# Patient Record
Sex: Female | Born: 1970 | Race: Black or African American
Health system: Southern US, Community
[De-identification: ages and names within clinical notes are randomized; demographics above are authoritative.]

## PROBLEM LIST (undated history)

## (undated) DIAGNOSIS — E1110 Type 2 diabetes mellitus with ketoacidosis without coma: Secondary | ICD-10-CM

## (undated) DIAGNOSIS — D649 Anemia, unspecified: Secondary | ICD-10-CM

## (undated) DIAGNOSIS — J45909 Unspecified asthma, uncomplicated: Secondary | ICD-10-CM

## (undated) DIAGNOSIS — K219 Gastro-esophageal reflux disease without esophagitis: Secondary | ICD-10-CM

## (undated) DIAGNOSIS — E119 Type 2 diabetes mellitus without complications: Secondary | ICD-10-CM

## (undated) DIAGNOSIS — H539 Unspecified visual disturbance: Secondary | ICD-10-CM

## (undated) DIAGNOSIS — N189 Chronic kidney disease, unspecified: Secondary | ICD-10-CM

## (undated) HISTORY — PX: CORONARY ANGIOPLASTY WITH STENT PLACEMENT: SHX49

---

## 1998-10-18 ENCOUNTER — Other Ambulatory Visit: Admission: RE | Admit: 1998-10-18 | Discharge: 1998-10-18 | Payer: Self-pay | Admitting: Obstetrics & Gynecology

## 1999-09-24 ENCOUNTER — Inpatient Hospital Stay (HOSPITAL_COMMUNITY): Admission: EM | Admit: 1999-09-24 | Discharge: 1999-09-26 | Payer: Self-pay | Admitting: Emergency Medicine

## 1999-09-28 ENCOUNTER — Emergency Department (HOSPITAL_COMMUNITY): Admission: EM | Admit: 1999-09-28 | Discharge: 1999-09-28 | Payer: Self-pay | Admitting: Emergency Medicine

## 1999-10-02 ENCOUNTER — Encounter: Admission: RE | Admit: 1999-10-02 | Discharge: 1999-12-31 | Payer: Self-pay | Admitting: Internal Medicine

## 1999-11-15 ENCOUNTER — Other Ambulatory Visit: Admission: RE | Admit: 1999-11-15 | Discharge: 1999-11-15 | Payer: Self-pay | Admitting: Obstetrics & Gynecology

## 2000-01-24 ENCOUNTER — Encounter: Admission: RE | Admit: 2000-01-24 | Discharge: 2000-04-23 | Payer: Self-pay | Admitting: Internal Medicine

## 2001-07-04 ENCOUNTER — Ambulatory Visit
Admit: 2001-07-04 | Disposition: A | Discharge status: Home / Self Care | Payer: Self-pay | Source: Ambulatory Visit | Admitting: Obstetrics & Gynecology

## 2001-08-01 ENCOUNTER — Ambulatory Visit
Admit: 2001-08-01 | Disposition: A | Discharge status: Home / Self Care | Payer: Self-pay | Source: Ambulatory Visit | Admitting: Obstetrics & Gynecology

## 2001-08-12 ENCOUNTER — Ambulatory Visit
Admit: 2001-08-12 | Disposition: A | Discharge status: Home / Self Care | Payer: Self-pay | Source: Ambulatory Visit | Admitting: Obstetrics & Gynecology

## 2001-08-29 ENCOUNTER — Ambulatory Visit
Admit: 2001-08-29 | Disposition: A | Discharge status: Home / Self Care | Payer: Self-pay | Source: Ambulatory Visit | Admitting: Obstetrics & Gynecology

## 2001-11-10 ENCOUNTER — Ambulatory Visit
Admit: 2001-11-10 | Disposition: A | Discharge status: Home / Self Care | Payer: Self-pay | Source: Ambulatory Visit | Admitting: Obstetrics & Gynecology

## 2001-11-13 ENCOUNTER — Ambulatory Visit
Admit: 2001-11-13 | Disposition: A | Discharge status: Home / Self Care | Payer: Self-pay | Source: Ambulatory Visit | Admitting: Obstetrics & Gynecology

## 2001-11-17 ENCOUNTER — Ambulatory Visit
Admit: 2001-11-17 | Disposition: A | Discharge status: Home / Self Care | Payer: Self-pay | Source: Ambulatory Visit | Admitting: Obstetrics & Gynecology

## 2001-11-25 ENCOUNTER — Ambulatory Visit
Admit: 2001-11-25 | Disposition: A | Discharge status: Home / Self Care | Payer: Self-pay | Source: Ambulatory Visit | Admitting: Obstetrics & Gynecology

## 2001-12-01 ENCOUNTER — Ambulatory Visit
Admit: 2001-12-01 | Disposition: A | Discharge status: Home / Self Care | Payer: Self-pay | Source: Ambulatory Visit | Admitting: Obstetrics & Gynecology

## 2001-12-29 ENCOUNTER — Inpatient Hospital Stay (HOSPITAL_COMMUNITY): Admission: AD | Admit: 2001-12-29 | Discharge: 2002-01-01 | Payer: Self-pay | Admitting: Obstetrics & Gynecology

## 2002-11-04 ENCOUNTER — Emergency Department: Admit: 2002-11-04 | Payer: Self-pay | Source: Emergency Department | Admitting: Emergency Medicine

## 2004-06-23 ENCOUNTER — Emergency Department: Admit: 2004-06-23 | Payer: Self-pay | Source: Emergency Department | Admitting: Emergency Medicine

## 2011-03-13 ENCOUNTER — Ambulatory Visit
Admit: 2011-03-13 | Discharge: 2011-03-13 | Disposition: A | Discharge status: Home / Self Care | Payer: Self-pay | Source: Ambulatory Visit

## 2011-10-06 ENCOUNTER — Encounter (HOSPITAL_COMMUNITY): Payer: Self-pay | Admitting: Adult Health

## 2011-10-06 ENCOUNTER — Emergency Department (HOSPITAL_COMMUNITY)
Admission: EM | Admit: 2011-10-06 | Discharge: 2011-10-06 | Disposition: A | Discharge status: Home / Self Care | Payer: BC Managed Care – PPO | Attending: Emergency Medicine | Admitting: Emergency Medicine

## 2011-10-06 DIAGNOSIS — R112 Nausea with vomiting, unspecified: Secondary | ICD-10-CM | POA: Insufficient documentation

## 2011-10-06 DIAGNOSIS — R197 Diarrhea, unspecified: Secondary | ICD-10-CM | POA: Insufficient documentation

## 2011-10-06 DIAGNOSIS — R1013 Epigastric pain: Secondary | ICD-10-CM | POA: Insufficient documentation

## 2011-10-06 DIAGNOSIS — E119 Type 2 diabetes mellitus without complications: Secondary | ICD-10-CM | POA: Insufficient documentation

## 2011-10-06 HISTORY — DX: Type 2 diabetes mellitus with ketoacidosis without coma: E11.10

## 2011-10-06 LAB — BLOOD GAS, VENOUS
Patient temperature: 98.6
pCO2, Ven: 33.2 mmHg — ABNORMAL LOW (ref 45.0–50.0)
pH, Ven: 7.449 — ABNORMAL HIGH (ref 7.250–7.300)

## 2011-10-06 LAB — COMPREHENSIVE METABOLIC PANEL
ALT: 7 U/L (ref 0–35)
AST: 9 U/L (ref 0–37)
Alkaline Phosphatase: 82 U/L (ref 39–117)
CO2: 25 mEq/L (ref 19–32)
GFR calc Af Amer: >90 mL/min (ref >90)
Glucose, Bld: 253 mg/dL — ABNORMAL HIGH (ref 70–99)
Potassium: 3.3 mEq/L — ABNORMAL LOW (ref 3.5–5.1)
Sodium: 133 mEq/L — ABNORMAL LOW (ref 135–145)
Total Protein: 7.9 g/dL (ref 6.0–8.3)

## 2011-10-06 LAB — DIFFERENTIAL
Eosinophils Absolute: 0 10*3/uL (ref 0.0–0.7)
Lymphocytes Relative: 6 % — ABNORMAL LOW (ref 12–46)
Lymphs Abs: 0.7 10*3/uL (ref 0.7–4.0)
Neutrophils Relative %: 91 % — ABNORMAL HIGH (ref 43–77)

## 2011-10-06 LAB — CBC
Platelets: 314 10*3/uL (ref 150–400)
RBC: 4.33 MIL/uL (ref 3.87–5.11)
WBC: 13.5 10*3/uL — ABNORMAL HIGH (ref 4.0–10.5)

## 2011-10-06 MED ORDER — SODIUM CHLORIDE 0.9 % IV BOLUS (SEPSIS)
1000.0000 mL | Freq: Once | INTRAVENOUS | Status: AC
  Administered 2011-10-06: 1000 mL via INTRAVENOUS

## 2011-10-06 MED ORDER — ACETAMINOPHEN 325 MG PO TABS
ORAL_TABLET | ORAL | Status: AC
  Administered 2011-10-06: 650 mg
  Filled 2011-10-06: qty 2

## 2011-10-06 MED ORDER — ONDANSETRON HCL 4 MG/2ML IJ SOLN
4.0000 mg | Freq: Once | INTRAMUSCULAR | Status: AC
  Administered 2011-10-06: 4 mg via INTRAVENOUS
  Filled 2011-10-06: qty 2

## 2011-10-06 NOTE — ED Notes (Signed)
Pt given ice chips for dry mouth

## 2011-10-06 NOTE — ED Notes (Addendum)
Sudden onset of vomitting and diarrhea that began this am while driving from [place]. Pt states it feels just like when she had DKA. HR 118, BP 121/84 CBG 247.  Admits to going without humalog for one week. Alert and oriented, answer questions appropriately. Pt is currently taking her lantus regularly.

## 2011-10-06 NOTE — ED Provider Notes (Signed)
History     CSN: [id number]  Arrival date & time 10/06/11  1541   First MD Initiated Contact with Patient 10/06/11 1603      Chief Complaint  Patient presents with  . Diarrhea  . Emesis    HPI The patient presents with one day of nausea, vomiting, diarrhea, mild epigastric discomfort.  She notes that she has been in her usual state of health.  This morning, approximately 9 hours ago, following a fast food breakfast, the patient gradually developed discomfort.  Since onset she has had the aforementioned symptoms persistently.  Symptoms did not improve following a period of rest.  She has not tried any pharmacologic means of relief.  No clear exacerbating factors.  No fevers, no confusion, no dyspnea, no other focal complaints. Past Medical History  Diagnosis Date  . Diabetes mellitus   . DKA (diabetic ketoacidoses)     Past Surgical History  Procedure Date  . Cesarean section     History reviewed. No pertinent family history.  History  Substance Use Topics  . Smoking status: Never Smoker   . Smokeless tobacco: Not on file  . Alcohol Use: No    OB History    Grav Para Term Preterm Abortions TAB SAB Ect Mult Living                  Review of Systems  Constitutional:       HPI  HENT:       HPI otherwise negative  Eyes: Negative.   Respiratory:       HPI, otherwise negative  Cardiovascular:       HPI, otherwise nmegative  Gastrointestinal: Positive for nausea, vomiting and diarrhea. Negative for abdominal pain and blood in stool.  Genitourinary:       HPI, otherwise negative  Musculoskeletal:       HPI, otherwise negative  Skin: Negative.   Neurological: Negative for syncope.    Allergies  Penicillins  Home Medications   Current Outpatient Rx  Name Route Sig Dispense Refill  . FERROUS SULFATE 325 (65 FE) MG PO TABS Oral Take 325 mg by mouth daily with breakfast.    . INSULIN GLARGINE 100 UNIT/ML [place] SOLN Subcutaneous Inject 30 Units into the skin at  bedtime.    . INSULIN LISPRO (HUMAN) 100 UNIT/ML [place] SOLN Subcutaneous Inject 12 Units into the skin 3 (three) times daily before meals.    Marland Kitchen RAMIPRIL 2.5 MG PO CAPS Oral Take 2.5 mg by mouth daily.    Marland Kitchen VITAMIN B-12 1000 MCG PO TABS Oral Take 1,000 mcg by mouth daily.      BP 121/84  Pulse 118  Temp(Src) 98.5 F (36.9 C) (Oral)  Resp 20  Wt 197 lb (89.359 kg)  SpO2 99%  Physical Exam  Nursing note and vitals reviewed. Constitutional: She is oriented to person, place, and time. She appears well-developed and well-nourished. No distress.  HENT:  Head: Normocephalic and atraumatic.  Eyes: Conjunctivae and EOM are normal.  Cardiovascular: Regular rhythm.  Tachycardia present.   Pulmonary/Chest: Effort normal and breath sounds normal. No stridor. No respiratory distress.  Abdominal: She exhibits no distension.  Musculoskeletal: She exhibits no edema.  Neurological: She is alert and oriented to person, place, and time. No cranial nerve deficit.  Skin: Skin is warm and dry.  Psychiatric: She has a normal mood and affect.    ED Course  Procedures (including critical care time)  Labs Reviewed  GLUCOSE, CAPILLARY - Abnormal; Notable  for the following:    Glucose-Capillary 246 (*)    All other components within normal limits  CBC  DIFFERENTIAL  COMPREHENSIVE METABOLIC PANEL  URINALYSIS, ROUTINE W REFLEX MICROSCOPIC  BLOOD GAS, VENOUS   No results found.   No diagnosis found.   Rule out diabetic ketoacidosis.  Symptoms likely dehydration versus acute food reaction.  10:33 PM Patient notes substantial reduction in her discomfort.   MDM  This 41 year old female insulin-dependent diabetes now presents on the day of nausea, vomiting, diarrhea.  On exam she is in no distress, though she is mildly tachycardic.  With her history is concern for DKA or infectious etiology.  The patient's labs are reassuring for the absence of an anion gap.  She does have mild leukocytosis.  Following  ED interventions, showed a substantial improvement in her clinical condition.  He was discharged in stable condition to follow up with her primary care physician.        Gerhard Munch, MD 10/06/11 2234

## 2011-10-06 NOTE — ED Notes (Signed)
Pt sts that her N/V/D since earlier this morning. Sts that she is from [place]. Bowel sounds audible x 4 quads.

## 2011-10-08 LAB — BLOOD GAS, VENOUS
Patient temperature: 98.6
pCO2, Ven: 40.6 mmHg — ABNORMAL LOW (ref 45.0–50.0)
pH, Ven: 7.405 — ABNORMAL HIGH (ref 7.250–7.300)

## 2011-11-15 LAB — BLOOD GAS, VENOUS

## 2012-10-01 ENCOUNTER — Ambulatory Visit: Payer: No Typology Code available for payment source

## 2012-10-08 ENCOUNTER — Encounter: Payer: Self-pay | Admitting: Certified Registered"

## 2012-10-08 ENCOUNTER — Encounter
Admission: RE | Disposition: A | Discharge status: Home / Self Care | Payer: Self-pay | Source: Ambulatory Visit | Attending: Obstetrics & Gynecology

## 2012-10-08 ENCOUNTER — Ambulatory Visit: Payer: No Typology Code available for payment source | Admitting: Certified Registered"

## 2012-10-08 ENCOUNTER — Ambulatory Visit: Payer: No Typology Code available for payment source | Admitting: Obstetrics & Gynecology

## 2012-10-08 ENCOUNTER — Ambulatory Visit
Admission: RE | Admit: 2012-10-08 | Discharge: 2012-10-08 | Disposition: A | Discharge status: Home / Self Care | Payer: No Typology Code available for payment source | Source: Ambulatory Visit | Attending: Obstetrics & Gynecology | Admitting: Obstetrics & Gynecology

## 2012-10-08 DIAGNOSIS — J45909 Unspecified asthma, uncomplicated: Secondary | ICD-10-CM | POA: Insufficient documentation

## 2012-10-08 DIAGNOSIS — K219 Gastro-esophageal reflux disease without esophagitis: Secondary | ICD-10-CM | POA: Insufficient documentation

## 2012-10-08 DIAGNOSIS — Z79899 Other long term (current) drug therapy: Secondary | ICD-10-CM | POA: Insufficient documentation

## 2012-10-08 DIAGNOSIS — N920 Excessive and frequent menstruation with regular cycle: Secondary | ICD-10-CM | POA: Insufficient documentation

## 2012-10-08 DIAGNOSIS — N8000 Endometriosis of the uterus, unspecified: Secondary | ICD-10-CM | POA: Insufficient documentation

## 2012-10-08 DIAGNOSIS — Z880 Allergy status to penicillin: Secondary | ICD-10-CM | POA: Insufficient documentation

## 2012-10-08 DIAGNOSIS — D649 Anemia, unspecified: Secondary | ICD-10-CM | POA: Insufficient documentation

## 2012-10-08 DIAGNOSIS — E119 Type 2 diabetes mellitus without complications: Secondary | ICD-10-CM | POA: Insufficient documentation

## 2012-10-08 HISTORY — DX: Type 2 diabetes mellitus without complications: E11.9

## 2012-10-08 HISTORY — DX: Unspecified visual disturbance: H53.9

## 2012-10-08 HISTORY — DX: Unspecified asthma, uncomplicated: J45.909

## 2012-10-08 HISTORY — DX: Anemia, unspecified: D64.9

## 2012-10-08 HISTORY — DX: Type 2 diabetes mellitus with ketoacidosis without coma: E11.10

## 2012-10-08 HISTORY — DX: Gastro-esophageal reflux disease without esophagitis: K21.9

## 2012-10-08 HISTORY — PX: HYSTEROSCOPY, ENDOMETRIAL ABLATION, THERMACHOICE: SHX4233

## 2012-10-08 LAB — POCT GLUCOSE: Whole Blood Glucose POCT: 218 mg/dL — AB (ref 70–100)

## 2012-10-08 LAB — POCT PREGNANCY TEST, URINE HCG: POCT Pregnancy HCG Test, UR: NEGATIVE

## 2012-10-08 SURGERY — HYSTEROSCOPY, ENDOMETRIAL ABLATION, THERMACHOICE
Anesthesia: Anesthesia General | Site: Uterus | Wound class: Clean Contaminated

## 2012-10-08 MED ORDER — ONDANSETRON HCL 4 MG/2ML IJ SOLN
INTRAMUSCULAR | Status: DC | PRN
Start: 2012-10-08 — End: 2012-10-08
  Administered 2012-10-08: 4 mg via INTRAVENOUS

## 2012-10-08 MED ORDER — ONDANSETRON HCL 4 MG/2ML IJ SOLN
4.0000 mg | Freq: Once | INTRAMUSCULAR | Status: DC | PRN
Start: 2012-10-08 — End: 2012-10-08

## 2012-10-08 MED ORDER — SILVER NITRATE-POT NITRATE 75-25 % EX MISC
CUTANEOUS | Status: AC
Start: 2012-10-08 — End: ?
  Filled 2012-10-08: qty 60

## 2012-10-08 MED ORDER — HYDROMORPHONE HCL PF 1 MG/ML IJ SOLN
INTRAMUSCULAR | Status: AC
Start: 2012-10-08 — End: 2012-10-08
  Administered 2012-10-08: 0.2 mg via INTRAVENOUS
  Filled 2012-10-08: qty 1

## 2012-10-08 MED ORDER — OXYCODONE-ACETAMINOPHEN 5-325 MG PO TABS
1.0000 | ORAL_TABLET | ORAL | Status: DC | PRN
Start: 2012-10-08 — End: 2012-10-08
  Administered 2012-10-08: 1 via ORAL

## 2012-10-08 MED ORDER — FENTANYL CITRATE 0.05 MG/ML IJ SOLN
INTRAMUSCULAR | Status: DC | PRN
Start: 2012-10-08 — End: 2012-10-08
  Administered 2012-10-08: 50 ug via INTRAVENOUS
  Administered 2012-10-08: 25 ug via INTRAVENOUS
  Administered 2012-10-08: 50 ug via INTRAVENOUS

## 2012-10-08 MED ORDER — PROMETHAZINE HCL 25 MG/ML IJ SOLN
6.2500 mg | Freq: Once | INTRAMUSCULAR | Status: DC | PRN
Start: 2012-10-08 — End: 2012-10-08

## 2012-10-08 MED ORDER — FENTANYL CITRATE 0.05 MG/ML IJ SOLN
INTRAMUSCULAR | Status: AC
Start: 2012-10-08 — End: ?
  Filled 2012-10-08: qty 5

## 2012-10-08 MED ORDER — ACETAMINOPHEN-CODEINE #3 300-30 MG PO TABS
1.0000 | ORAL_TABLET | Freq: Four times a day (QID) | ORAL | Status: DC | PRN
Start: 2012-10-08 — End: 2014-12-23

## 2012-10-08 MED ORDER — FENTANYL CITRATE 0.05 MG/ML IJ SOLN
25.0000 ug | INTRAMUSCULAR | Status: DC | PRN
Start: 2012-10-08 — End: 2012-10-08

## 2012-10-08 MED ORDER — OXYCODONE-ACETAMINOPHEN 5-325 MG PO TABS
ORAL_TABLET | ORAL | Status: DC
Start: 2012-10-08 — End: 2012-10-08
  Filled 2012-10-08: qty 1

## 2012-10-08 MED ORDER — MIDAZOLAM HCL 2 MG/2ML IJ SOLN
INTRAMUSCULAR | Status: DC | PRN
Start: 2012-10-08 — End: 2012-10-08
  Administered 2012-10-08: 2 mg via INTRAVENOUS

## 2012-10-08 MED ORDER — LACTATED RINGERS IV SOLN
INTRAVENOUS | Status: DC | PRN
Start: 2012-10-08 — End: 2012-10-08

## 2012-10-08 MED ORDER — HYDROMORPHONE HCL PF 1 MG/ML IJ SOLN
0.2000 mg | INTRAMUSCULAR | Status: DC | PRN
Start: 2012-10-08 — End: 2012-10-08
  Administered 2012-10-08 (×2): 0.2 mg via INTRAVENOUS

## 2012-10-08 MED ORDER — PROPOFOL INFUSION 10 MG/ML
INTRAVENOUS | Status: DC | PRN
Start: 2012-10-08 — End: 2012-10-08
  Administered 2012-10-08: 200 mg via INTRAVENOUS
  Administered 2012-10-08: 70 mg via INTRAVENOUS

## 2012-10-08 MED ORDER — DEXTROSE 5 % IV SOLN
INTRAVENOUS | Status: AC
Start: 2012-10-08 — End: ?
  Filled 2012-10-08: qty 100

## 2012-10-08 MED ORDER — MIDAZOLAM HCL 2 MG/2ML IJ SOLN
INTRAMUSCULAR | Status: AC
Start: 2012-10-08 — End: ?
  Filled 2012-10-08: qty 2

## 2012-10-08 SURGICAL SUPPLY — 24 items
BAG DECANTER STERILE (Procedure Accessories) IMPLANT
BALLOON THERMACHOICE II (Balloon) IMPLANT
CATH ROB NEL 16F (Catheter Urine) ×2 IMPLANT
DRAPE SRG CNVRT 44X40IN LF STRL FLTR (Drape) ×2
DRAPE SURGICAL FILTER SCREEN FLUID CONTROL POUCH DRAINAGE PORT L44 IN (Drape) ×1 IMPLANT
DRESSING TELFA 3X8IN STERILE (Dressing) ×2 IMPLANT
GLOVE SRG NTR RBR 7 BGL IND 288X91MM LTX (Glove) ×1
GLOVE SURG BIOGEL SZ6.5 (Glove) ×2 IMPLANT
GLOVE SURGICAL 7 BIOGEL INDICATOR POWDER (Glove) ×1
GLOVE SURGICAL 7 BIOGEL INDICATOR POWDER FREE SMOOTH BEAD CUFF (Glove) ×1 IMPLANT
GOWN OPTIMA STRL BACK OR (Gown) ×4 IMPLANT
PACK LITHOTOMY (Pack) ×2 IMPLANT
PAD SANITARY L12.25 IN X W4.25 IN HEAVY ABSORBENT MOISTURE BARRIER (Dressing) IMPLANT
PAD SNTR SLK FLF CRTY 12.25X4.25IN LF NS (Dressing) ×1
SET IRR DEHP 10 GTT/ML STRG 81IN LF STRL (Tubing) ×1
SET IRRIGATION L81 IN 10 GTT/ML STRAIGHT (Tubing) ×1
SET IRRIGATION L81 IN 10 GTT/ML STRAIGHT NA DEHP BLADDER REGULATE (Tubing) ×1 IMPLANT
SPONGE SRG VISTEC 8X4IN LF STRL 12 PLY (Sponge) ×1
SPONGE SURGICAL L8 IN X W4 IN 12 PLY (Sponge) ×1
SPONGE SURGICAL L8 IN X W4 IN 12 PLY RADIOPAQUE BAND VISTEC BLUE WHITE (Sponge) ×1 IMPLANT
TOWEL L26 IN X W17 IN COTTON PREWASH (Procedure Accessories) ×1
TOWEL L26 IN X W17 IN COTTON PREWASH DELINT BLUE ACTISORB SURGICAL (Procedure Accessories) ×1 IMPLANT
TOWEL SRG CTTN 26X17IN LF STRL PREWASH (Procedure Accessories) ×1
TUBE ARTHRO CASSETTE SET (Ortho Supply) ×2 IMPLANT

## 2012-10-08 NOTE — Op Note (Signed)
FULL OPERATIVE NOTE    Date Time: 10/08/2012 3:17 PM  Patient Name: Elizabeth Dunlap  Attending Physician: Larae Grooms, MD      Date of Operation:   10/08/2012    Providers Performing:   Surgeon(s):  Larae Grooms, MD    Anesthesia Type:   LMA  Operative Procedure:   Diagnostic hysteroscopy, Thermachoice  endometrial ablation    Preoperative Diagnosis:   Menorrhagia, adenomyosis    Postoperative Diagnosis:   Menorrhagia, adenomyosis    Indications:    Menorrhagia    Operative Notes:   The patient was taken to the operating room, placed on the table in supine position. Anesthesia was initiated. She was then prepped and draped in the usual manner. In and out catheterization. Speculum placed in the vagina. Single-tooth tenaculum was used to grasp the anterior lip of the cervix. The cervical length was obtained. The uterus was then sounded. Cervix was dilated to admit the diagnostic hysteroscope. Hysteroscopy was performed using saline. The cavity was normal in shape and contour. The hysteroscope was the removed. The Thermachoice device was assembled . The ablation was performed for 8 minutes. The apparatus was then removed. Hysteroscope was removed. Single-tooth tenaculum was removed. Hemostasis was good. Vagina was wiped clean. Speculum was removed. The patient tolerated the procedure well and was transferred to recovery room in satisfactory condition.      Estimated Blood Loss:   Minimum    Specimens:       Findings:   Uterus sounds to 8 10 cm.  Complications:    None      Larae Grooms, MD  10/08/2012  3:17 PM

## 2012-10-08 NOTE — H&P (Signed)
I have reviewed the H&P, examined the patient and there are no changes.

## 2012-10-08 NOTE — Anesthesia Postprocedure Evaluation (Signed)
Anesthesia Post Evaluation    Patient: Elizabeth Dunlap    Procedures performed: Procedure(s) with comments:  HYSTEROSCOPY, ENDOMETRIAL ABLATION, THERMACHOICE - thermachoice    Anesthesia type: General LMA    Patient location:Phase II PACU    Last vitals:   Filed Vitals:    10/08/12 1710   BP: 128/78   Pulse: 80   Temp: 98 F (36.7 C)   Resp: 16   SpO2: 99%       Post pain: Patient not complaining of pain, continue current therapy     Mental Status:awake    Respiratory Function: tolerating room air    Cardiovascular: stable    Nausea/Vomiting: patient not complaining of nausea or vomiting    Hydration Status: adequate    Post assessment: no apparent anesthetic complications

## 2012-10-08 NOTE — Anesthesia Preprocedure Evaluation (Addendum)
Anesthesia Evaluation    AIRWAY    Mallampati: II    TM distance: <3 FB  Neck ROM: full  Mouth Opening:full   CARDIOVASCULAR    cardiovascular exam normal     DENTAL    No notable dental hx     PULMONARY    pulmonary exam normal     OTHER FINDINGS                  Anesthesia Plan    ASA 3   general         Post op pain management: per surgeon        intravenous induction     informed consent obtained    Plan discussed with CRNA.

## 2012-10-08 NOTE — Discharge Instructions (Signed)
Discharge Instructions for Hysteroscopy & Ablation   Your doctor performed a Hysteroscopy. The reasons for having this procedure vary from person to person. The hysteroscopy may be performed to control heavy uterine bleeding or to find the cause of irregular bleeding.   Home Care   Take it easy.   Expect watery vaginal discharge for 1-2 weeks   Return to your normal activities after 24-48 hours. You may also return to work at that time.   Eat a normal diet.   Take an over-the-counter pain reliever for pain, if needed.   Remember, it's okay to have bleeding for about a week after the procedure. The amount of bleeding should be similar to what you have during a normal period.   Don't lift anything heavier than 10 pounds for 1 week after the procedure.   Don't drive for 24 hours after the procedure.   Don't have sexual intercourse or use tampons or douches until your doctor says it's safe to do so. This usually takes 2 weeks.  Follow-Up   Make a follow-up appointment in 2 weeks.   When to Call Your Doctor   Call your doctor immediately if you have any of the following:   Bleeding that soaks more than one sanitary pad in one hour   Severe abdominal pain   Severe cramps   Fever above 100.23F   Chills   Smelly discharge from your vagina        Post Anesthesia Discharge Instructions    Although you may be awake and alert in the recovery room, small amounts of anesthetic remain in your system for about 24 hours.  You may feel tired and sleepy during this time.      You are advised to go directly home from the hospital.    Plan to stay at home and rest for the remainder of the day.    It is advisable to have someone with you at home for 24 hours after surgery.    Do not operate a motor vehicle, or any mechanical or electrical equipment for the next 24 hours.      Be careful when you are walking around, you may become dizzy.  The effects of anesthesia and/or medications are still present and drowsiness may occur    Do not  consume alcohol, tranquilizers, sleeping medications, or any other non prescribed medication for the remainder of the day.    Diet:  begin with liquids, progress your diet as tolerated or as directed by your surgeon.  Nausea and vomiting may occur in the next 24 hours.

## 2012-10-08 NOTE — Progress Notes (Signed)
Patient up with assistance, c/o nausea, refused to take any nausea medicine, ok to go home and rest, will call MD if nausea persists

## 2012-10-08 NOTE — Transfer of Care (Signed)
Anesthesia Transfer of Care Note    Patient: Elizabeth Dunlap    Procedures performed: Procedure(s) with comments:  HYSTEROSCOPY, ENDOMETRIAL ABLATION, THERMACHOICE - thermachoice    Anesthesia type: General LMA    Patient location:Phase I PACU    Last vitals:   Filed Vitals:    10/08/12 1538   BP: 123/81   Pulse: 89   Temp: 97.2 F (36.2 C)   Resp: 20   SpO2: 100%       Post pain: Patient not complaining of pain, continue current therapy     Mental Status:awake and alert     Respiratory Function: tolerating nasal cannula    Cardiovascular: stable    Nausea/Vomiting: patient not complaining of nausea or vomiting    Hydration Status: adequate    Post assessment: no apparent anesthetic complications and no reportable events

## 2012-10-08 NOTE — Discharge Summary (Signed)
Discharge Instructions for Hysteroscopy & Ablation   Your doctor performed a Hysteroscopy. The reasons for having this procedure vary from person to person. The hysteroscopy may be performed to control heavy uterine bleeding or to find the cause of irregular bleeding.  Home Care   Take it easy.    Expect watery vaginal discharge for 1-2 weeks   Return to your normal activities after 24-48 hours. You may also return to work at that time.   Eat a normal diet.   Take an over-the-counter pain reliever for pain, if needed.   Remember, it's okay to have bleeding for about a week after the procedure. The amount of bleeding should be similar to what you have during a normal period.   Don't lift anything heavier than 10 pounds for 1 week after the procedure.   Don't drive for 24 hours after the procedure.   Don't have sexual intercourse or use tampons or douches until your doctor says it's safe to do so. This usually takes 2 weeks.  Follow-Up   Make a follow-up appointment in 2 weeks.    When to Call Your Doctor  Call your doctor immediately if you have any of the following:   Bleeding that soaks more than one sanitary pad in one hour   Severe abdominal pain   Severe cramps   Fever above 100.58F   Chills   Smelly discharge from your vagina

## 2012-10-09 ENCOUNTER — Encounter: Payer: Self-pay | Admitting: Obstetrics & Gynecology

## 2014-01-30 ENCOUNTER — Emergency Department: Payer: BC Managed Care – PPO

## 2014-01-30 ENCOUNTER — Emergency Department
Admission: EM | Admit: 2014-01-30 | Discharge: 2014-01-30 | Disposition: A | Discharge status: Home / Self Care | Payer: BC Managed Care – PPO | Attending: Emergency Medicine | Admitting: Emergency Medicine

## 2014-01-30 DIAGNOSIS — M62838 Other muscle spasm: Secondary | ICD-10-CM | POA: Insufficient documentation

## 2014-01-30 DIAGNOSIS — J45909 Unspecified asthma, uncomplicated: Secondary | ICD-10-CM | POA: Insufficient documentation

## 2014-01-30 DIAGNOSIS — E119 Type 2 diabetes mellitus without complications: Secondary | ICD-10-CM | POA: Insufficient documentation

## 2014-01-30 DIAGNOSIS — Z862 Personal history of diseases of the blood and blood-forming organs and certain disorders involving the immune mechanism: Secondary | ICD-10-CM | POA: Insufficient documentation

## 2014-01-30 LAB — URINALYSIS, REFLEX TO MICROSCOPIC EXAM IF INDICATED
Bilirubin, UA: NEGATIVE
Glucose, UA: 500 — AB
Ketones UA: NEGATIVE
Leukocyte Esterase, UA: NEGATIVE
Nitrite, UA: NEGATIVE
Protein, UR: 100 — AB
Specific Gravity UA: 1.027 (ref 1.001–1.035)
Urine pH: 5 (ref 5.0–8.0)
Urobilinogen, UA: 2 mg/dL (ref 0.2–2.0)

## 2014-01-30 LAB — POCT PREGNANCY TEST, URINE HCG: POCT Pregnancy HCG Test, UR: NEGATIVE

## 2014-01-30 MED ORDER — KETOROLAC TROMETHAMINE 30 MG/ML IJ SOLN
30.00 mg | Freq: Once | INTRAMUSCULAR | Status: AC
Start: 2014-01-30 — End: 2014-01-30
  Administered 2014-01-30: 30 mg via INTRAMUSCULAR
  Filled 2014-01-30: qty 1

## 2014-01-30 MED ORDER — DIAZEPAM 2 MG PO TABS
2.0000 mg | ORAL_TABLET | Freq: Once | ORAL | Status: AC
Start: 2014-01-30 — End: 2014-01-30
  Administered 2014-01-30: 2 mg via ORAL
  Filled 2014-01-30: qty 1

## 2014-01-30 MED ORDER — DIAZEPAM 2 MG PO TABS
2.0000 mg | ORAL_TABLET | Freq: Three times a day (TID) | ORAL | Status: DC | PRN
Start: 2014-01-30 — End: 2014-12-23
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
## 2014-01-30 NOTE — ED Notes (Signed)
Pt discharged in stable condition. Instructions provided. Pt questions answered.

## 2014-01-30 NOTE — Discharge Instructions (Signed)
Please follow up with your primary care doctor and return if you have any problems or concerns  Please take your medications as prescribed (either the flexeril or valium) - and be sure to see sports medicine for physical therapy follow up.     Thank you for choosing [facility] for your emergency care needs.   We strive to provide EXCELLENT care to you and your family.     IF YOU DO NOT CONTINUE TO IMPROVE OR YOUR CONDITION WORSENS, PLEASE CONTACT YOUR DOCTOR OR RETURN IMMEDIATELY TO THE EMERGENCY DEPARTMENT.     DOCTOR REFERRALS   Call [phone] if you need any further referrals and we can help you find a primary care doctor or specialist. Also, available online at: [email or web address]     [place]   If you need help with health or social services, please call 2-1-1 for a free referral to resources in your area. 2-1-1 is a free service connecting people with information on health insurance, free clinics, pregnancy, mental health, dental care, food assistance, housing, and substance abuse counseling. Also, available online at: http://www.[employer].org     MEDICAL RECORDS AND TESTS   Certain laboratory test results do not come back the same day, for example urine cultures. We will contact you if other important findings are noted. Radiology films are often reviewed again to ensure accuracy. If there is any discrepancy, we will notify you.     ORTHOPEDIC INJURY   Please know that significant injuries can exist even when an initial x-ray is read as normal or negative. This can occur because some fractures (broken bones) are not initially visible on x-rays. For this reason, close outpatient follow-up with your primary care doctor or bone specialist (orthopedist) is required.     MEDICATIONS AND FOLLOWUP   Please be aware that some prescription medications can cause drowsiness. Use caution when driving or operating machinery.   The examination and treatment you have  received in our Emergency Department is provided on an emergency basis, and is not intended to be a substitute for your primary care physician. It is important that your doctor checks you again and that you report any new or remaining problems at that time.           Muscle Spasms    You have been diagnosed with muscle spasms.    A muscle spasm means that your muscles feel tight, crampy or painful. Many people have trouble relaxing their muscles when this happens. Most people will get a muscle spasm at some point.     There are many things that can cause muscle spasms. Some of them are:   Too much exercise.   Dehydration (often caused by heat exposure).   Electrolyte imbalance (low potassium, magnesium or phosphorus).   Changes in body fluids that can happen with liver or kidney failure.   Drug addiction and withdrawal.   Deficiency (not enough) of certain vitamins.   Peripheral Vascular Disease (narrow blood vessels in the legs).   Certain medications like furosemide (Lasix), albuterol (Proventil), cholesterol medications, and others.    You might need another exam or more tests to find out why you have these symptoms. At this time, the cause of your symptoms doesn't seem dangerous and you don t need to stay in the hospital.    Though we don't believe your condition is dangerous right now, it is important to be careful. Sometimes a problem that seems mild can become serious later. This  is why it is very important that you return here or go to the nearest Emergency Department if you are not improving or your symptoms are getting worse.    Some things you may try at home are:   Stretching.   Over-the-counter pain medications that have ibuprofen (Advil/Motrin) or acetaminophen (Tylenol) in them. Follow the directions on the package.   Massage.   Warm baths.   Eating a healthy and balanced diet.   Get plenty of rest.   Drink lots of liquids.   Exercise or activity if you are  careful.    Follow the instructions for any medication you are prescribed.    Follow up with your doctor if you are not getting better as expected.    YOU SHOULD SEEK MEDICAL ATTENTION IMMEDIATELY, EITHER HERE OR AT THE NEAREST EMERGENCY DEPARTMENT, IF ANY OF THE FOLLOWING HAPPENS:     You have a fever (temperature higher than 100.36F or 38C).   Your pain does not go away or gets worse.   Your urine (pee) turns red. This is a sign of muscle breakdown.   You do not feel better after treatment.   You have other symptoms, concerns, or don t get better as expected.    If you can t follow up with your doctor, or if at any time you feel you need to be rechecked or seen again, come back here or go to the nearest emergency department.       Torticollis    You have been diagnosed with "torticollis."    This is the medical term for muscle spasms in the neck and shoulder.    These muscle spasms are painful and may last from days to weeks. They make it hard to turn the head and move the neck and shoulder. It may be hard to lift using the affected arm.    Injuries like falls or broken bones do not usually cause these spasms. X-rays and other forms of radiology testing may be done but are usually normal. Often, these tests are not needed.    To treat it, rest the neck and affected shoulder. Do not do any heavy lifting and avoid repetitive activity. Do not hold the arm or shoulder up for long periods of time.    Gentle massage and range of motion exercises may help neck stiffness. The medical staff will show you how to do this. Warm, moist heat (like a warm, wet washcloth) may help the muscles relax and make you more comfortable.     Take any medicines as directed by the medical staff. You may be given restrictions for work or other activities. Follow them until symptoms get better or you follow up with your doctor.    It is important to follow up with the doctor, clinic or specialist as instructed by the  medical staff.    YOU SHOULD SEEK MEDICAL ATTENTION IMMEDIATELY, EITHER HERE OR AT THE NEAREST EMERGENCY DEPARTMENT, IF ANY OF THE FOLLOWING OCCURS:   Increasingly severe (serious) pain.   Arm weakness, paralysis or numbness.   Fever (temperature higher than 100.36F / 38C) or headache.   Swelling or redness around the neck, shoulder or armpit.   Chest pain or trouble breathing.

## 2014-12-22 ENCOUNTER — Observation Stay: Payer: BC Managed Care – PPO

## 2014-12-22 ENCOUNTER — Inpatient Hospital Stay: Payer: BC Managed Care – PPO | Admitting: Internal Medicine

## 2014-12-22 ENCOUNTER — Emergency Department: Payer: BC Managed Care – PPO

## 2014-12-22 ENCOUNTER — Observation Stay
Admission: EM | Admit: 2014-12-22 | Discharge: 2014-12-23 | Disposition: A | Discharge status: Home / Self Care | Payer: BC Managed Care – PPO | Attending: Internal Medicine | Admitting: Internal Medicine

## 2014-12-22 DIAGNOSIS — D638 Anemia in other chronic diseases classified elsewhere: Secondary | ICD-10-CM | POA: Insufficient documentation

## 2014-12-22 DIAGNOSIS — E785 Hyperlipidemia, unspecified: Secondary | ICD-10-CM | POA: Insufficient documentation

## 2014-12-22 DIAGNOSIS — I639 Cerebral infarction, unspecified: Secondary | ICD-10-CM | POA: Diagnosis present

## 2014-12-22 DIAGNOSIS — E860 Dehydration: Secondary | ICD-10-CM | POA: Insufficient documentation

## 2014-12-22 DIAGNOSIS — E869 Volume depletion, unspecified: Secondary | ICD-10-CM | POA: Diagnosis present

## 2014-12-22 DIAGNOSIS — K219 Gastro-esophageal reflux disease without esophagitis: Secondary | ICD-10-CM | POA: Insufficient documentation

## 2014-12-22 DIAGNOSIS — E1165 Type 2 diabetes mellitus with hyperglycemia: Secondary | ICD-10-CM | POA: Insufficient documentation

## 2014-12-22 DIAGNOSIS — N179 Acute kidney failure, unspecified: Secondary | ICD-10-CM | POA: Diagnosis present

## 2014-12-22 DIAGNOSIS — I10 Essential (primary) hypertension: Secondary | ICD-10-CM | POA: Insufficient documentation

## 2014-12-22 DIAGNOSIS — R739 Hyperglycemia, unspecified: Secondary | ICD-10-CM

## 2014-12-22 DIAGNOSIS — Z794 Long term (current) use of insulin: Secondary | ICD-10-CM | POA: Insufficient documentation

## 2014-12-22 DIAGNOSIS — E119 Type 2 diabetes mellitus without complications: Secondary | ICD-10-CM | POA: Diagnosis present

## 2014-12-22 DIAGNOSIS — Z880 Allergy status to penicillin: Secondary | ICD-10-CM | POA: Insufficient documentation

## 2014-12-22 DIAGNOSIS — R4701 Aphasia: Principal | ICD-10-CM | POA: Insufficient documentation

## 2014-12-22 LAB — BLOOD GAS, VENOUS
Base Excess, Ven: -2 mEq/L
HCO3, Ven: 23.2 mEq/L
O2 Sat, Venous: 38.3 %
Temperature: 37
Venous Total CO2: 21.6 mEq/L
pCO2, Venous: 43.7 mmhg
pH, Ven: 7.345
pO2, Venous: 24.7 mmhg

## 2014-12-22 LAB — COMPREHENSIVE METABOLIC PANEL
ALT: 10 U/L (ref 0–55)
AST (SGOT): 10 U/L (ref 5–34)
Albumin/Globulin Ratio: 1 (ref 0.9–2.2)
Albumin: 3.5 g/dL (ref 3.5–5.0)
Alkaline Phosphatase: 92 U/L (ref 37–106)
Anion Gap: 6 (ref 5.0–15.0)
BUN: 22 mg/dL — ABNORMAL HIGH (ref 7.0–19.0)
Bilirubin, Total: 0.2 mg/dL (ref 0.2–1.2)
CO2: 25 mEq/L (ref 22–29)
Calcium: 9.3 mg/dL (ref 8.5–10.5)
Chloride: 104 mEq/L (ref 100–111)
Creatinine: 1.3 mg/dL — ABNORMAL HIGH (ref 0.6–1.0)
Globulin: 3.6 g/dL (ref 2.0–3.6)
Glucose: 239 mg/dL — ABNORMAL HIGH (ref 70–100)
Potassium: 3.4 mEq/L — ABNORMAL LOW (ref 3.5–5.1)
Protein, Total: 7.1 g/dL (ref 6.0–8.3)
Sodium: 135 mEq/L — ABNORMAL LOW (ref 136–145)

## 2014-12-22 LAB — CBC AND DIFFERENTIAL
Basophils Absolute Automated: 0.02 10*3/uL (ref 0.00–0.20)
Basophils Automated: 0 %
Eosinophils Absolute Automated: 0.06 10*3/uL (ref 0.00–0.70)
Eosinophils Automated: 1 %
Hematocrit: 34 % — ABNORMAL LOW (ref 37.0–47.0)
Hgb: 11.8 g/dL — ABNORMAL LOW (ref 12.0–16.0)
Immature Granulocytes Absolute: 0.02 10*3/uL
Immature Granulocytes: 0 %
Lymphocytes Absolute Automated: 2.91 10*3/uL (ref 0.50–4.40)
Lymphocytes Automated: 28 %
MCH: 27.4 pg — ABNORMAL LOW (ref 28.0–32.0)
MCHC: 34.7 g/dL (ref 32.0–36.0)
MCV: 78.9 fL — ABNORMAL LOW (ref 80.0–100.0)
MPV: 11.5 fL (ref 9.4–12.3)
Monocytes Absolute Automated: 0.43 10*3/uL (ref 0.00–1.20)
Monocytes: 4 %
Neutrophils Absolute: 7.17 10*3/uL (ref 1.80–8.10)
Neutrophils: 68 %
Nucleated RBC: 0 /100 WBC (ref 0–1)
Platelets: 240 10*3/uL (ref 140–400)
RBC: 4.31 10*6/uL (ref 4.20–5.40)
RDW: 12 % (ref 12–15)
WBC: 10.59 10*3/uL (ref 3.50–10.80)

## 2014-12-22 LAB — HEMOLYSIS INDEX: Hemolysis Index: 6 (ref 0–18)

## 2014-12-22 LAB — GFR: EGFR: 53.5

## 2014-12-22 LAB — TROPONIN I: Troponin I: <0.01 ng/mL (ref 0.00–0.09)

## 2014-12-22 LAB — GLUCOSE WHOLE BLOOD - POCT: Whole Blood Glucose POCT: 97 mg/dL (ref 70–100)

## 2014-12-22 MED ORDER — ASPIRIN 81 MG PO CHEW
81.0000 mg | CHEWABLE_TABLET | Freq: Every day | ORAL | Status: DC
Start: 2014-12-23 — End: 2014-12-23
  Administered 2014-12-23: 81 mg via ORAL
  Filled 2014-12-22: qty 1

## 2014-12-22 MED ORDER — ENOXAPARIN SODIUM 40 MG/0.4ML SC SOLN
40.0000 mg | Freq: Every day | SUBCUTANEOUS | Status: DC
Start: 2014-12-23 — End: 2014-12-23
  Filled 2014-12-22: qty 0.4

## 2014-12-22 MED ORDER — GADOBUTROL 1 MMOL/ML IV SOLN
10.0000 mL | Freq: Once | INTRAVENOUS | Status: AC | PRN
Start: 2014-12-22 — End: 2014-12-22
  Administered 2014-12-22: 10 mmol via INTRAVENOUS

## 2014-12-22 MED ORDER — ACETAMINOPHEN 650 MG RE SUPP
650.0000 mg | RECTAL | Status: DC | PRN
Start: 2014-12-22 — End: 2014-12-23

## 2014-12-22 MED ORDER — SODIUM CHLORIDE 0.9 % IV BOLUS
1000.0000 mL | Freq: Once | INTRAVENOUS | Status: AC
Start: 2014-12-22 — End: 2014-12-22
  Administered 2014-12-22: 1000 mL via INTRAVENOUS

## 2014-12-22 MED ORDER — DIAZEPAM 2 MG PO TABS
2.0000 mg | ORAL_TABLET | Freq: Three times a day (TID) | ORAL | Status: DC | PRN
Start: 2014-12-22 — End: 2014-12-23

## 2014-12-22 MED ORDER — ONDANSETRON 4 MG PO TBDP
4.0000 mg | ORAL_TABLET | Freq: Three times a day (TID) | ORAL | Status: DC | PRN
Start: 2014-12-22 — End: 2014-12-23

## 2014-12-22 MED ORDER — ONDANSETRON HCL 4 MG/2ML IJ SOLN
4.0000 mg | Freq: Three times a day (TID) | INTRAMUSCULAR | Status: DC | PRN
Start: 2014-12-22 — End: 2014-12-23

## 2014-12-22 MED ORDER — HYDROCHLOROTHIAZIDE 12.5 MG PO TABS
ORAL_TABLET | Freq: Every day | ORAL | Status: DC
Start: 2014-12-23 — End: 2014-12-23
  Filled 2014-12-22 (×2): qty 1

## 2014-12-22 MED ORDER — DEXTROSE 50 % IV SOLN
25.0000 mL | INTRAVENOUS | Status: DC | PRN
Start: 2014-12-22 — End: 2014-12-23

## 2014-12-22 MED ORDER — INSULIN REGULAR HUMAN 100 UNIT/ML IJ SOLN
1.0000 [IU] | Freq: Three times a day (TID) | INTRAMUSCULAR | Status: DC | PRN
Start: 2014-12-22 — End: 2014-12-23

## 2014-12-22 MED ORDER — POTASSIUM CHLORIDE 10 MEQ/100ML IV SOLN
10.0000 meq | INTRAVENOUS | Status: AC
Start: 2014-12-22 — End: 2014-12-23
  Administered 2014-12-22 – 2014-12-23 (×2): 10 meq via INTRAVENOUS
  Filled 2014-12-22 (×2): qty 100

## 2014-12-22 MED ORDER — ACETAMINOPHEN-CODEINE #3 300-30 MG PO TABS
1.0000 | ORAL_TABLET | Freq: Four times a day (QID) | ORAL | Status: DC | PRN
Start: 2014-12-22 — End: 2014-12-23

## 2014-12-22 MED ORDER — ASPIRIN 81 MG PO CHEW
324.0000 mg | CHEWABLE_TABLET | Freq: Once | ORAL | Status: AC
Start: 2014-12-22 — End: 2014-12-22
  Administered 2014-12-22: 324 mg via ORAL
  Filled 2014-12-22: qty 4

## 2014-12-22 MED ORDER — ACETAMINOPHEN 325 MG PO TABS
650.0000 mg | ORAL_TABLET | ORAL | Status: DC | PRN
Start: 2014-12-22 — End: 2014-12-23

## 2014-12-22 MED ORDER — GLUCAGON 1 MG IJ SOLR (WRAP)
1.0000 mg | INTRAMUSCULAR | Status: DC | PRN
Start: 2014-12-22 — End: 2014-12-23

## 2014-12-22 MED ORDER — MORPHINE SULFATE 2 MG/ML IJ/IV SOLN (WRAP)
2.0000 mg | Status: DC | PRN
Start: 2014-12-22 — End: 2014-12-23

## 2014-12-22 MED ORDER — SODIUM CHLORIDE 0.9 % IV SOLN
INTRAVENOUS | Status: DC
Start: 2014-12-22 — End: 2014-12-23

## 2014-12-22 MED ORDER — ATORVASTATIN CALCIUM 40 MG PO TABS
40.0000 mg | ORAL_TABLET | Freq: Every day | ORAL | Status: DC
Start: 2014-12-23 — End: 2014-12-23
  Administered 2014-12-23: 40 mg via ORAL
  Filled 2014-12-22: qty 1

## 2014-12-22 MED ORDER — INSULIN REGULAR HUMAN 100 UNIT/ML IJ SOLN
1.0000 [IU] | Freq: Every evening | INTRAMUSCULAR | Status: DC | PRN
Start: 2014-12-22 — End: 2014-12-23

## 2014-12-22 MED ORDER — ACETAMINOPHEN 325 MG PO TABS
650.0000 mg | ORAL_TABLET | Freq: Four times a day (QID) | ORAL | Status: DC | PRN
Start: 2014-12-22 — End: 2014-12-23
  Administered 2014-12-22: 650 mg via ORAL
  Filled 2014-12-22: qty 2

## 2014-12-22 NOTE — ED Notes (Signed)
Pt reports 3 episodes of headache, slurred speech and expressive aphasia today. Resolved at present. During these episodes, pt reports that her thinking was "fuzzy".

## 2014-12-22 NOTE — ED Provider Notes (Signed)
EMERGENCY DEPARTMENT HISTORY AND PHYSICAL EXAM     Physician/Midlevel provider first contact with patient: 12/22/14 1745         Date: 12/22/2014  Patient Name: Elizabeth Dunlap    History of Presenting Illness     Chief Complaint   Patient presents with   . Headache   . Blurred Vision   . Aphasia       History Provided By: Pt    Chief Complaint: Speech changes  Onset: 8 hours ago, last episode 3.5 hours ago  Timing: Intermittent; 3 episodes, each lasting a few minutes  Severity: Moderate  Modifying Factors: None  Associated Symptoms: "Hazy" vision in bilateral eyes. Right arm weakness at baseline. Denies fever, CP, SOB or trauma or lower extremity weakness.     Additional History: Elizabeth Dunlap is a 44 y.o. female with h/o DM that pt describes as "poorly controlled" c/o speech changes (episodes of expressive aphasia) today. She states she had 3 of these episodes that lasted a few minutes each, with the first episode 8 hours ago and the last 3.5 hours ago. She also reports "hazy" vision in bilateral eyes. Pt also states that "I couldn't get my thoughts together while talking." She states that she would "lose my train of thought while talking" and was told by her friends who were listening that she "did not sound like herself." She was seen by her PCP today at which time she had BS of 400. Pt states she has been under some stress due to buying a house recently and her sugar has not been well controlled. She has had some heartburn and lower extremity cramping which she thinks may be related to that and thinks today's sx may also be related. Pt is speaking normally in the ED and states that she feels that her speech is normal right now and states all sx have resolved right now.  She states that she has had intermittent right arm weakness at baseline for the last 13 years. She denies any lower extremity weakness.     PCP: Cheryle Horsfall, MD    No current facility-administered medications for this encounter.      Current Outpatient Prescriptions   Medication Sig Dispense Refill   . Cholecalciferol (VITAMIN D PO) Take 5,000 Units by mouth every other day.        . Insulin Glargine (LANTUS SC) Inject 40 Units into the skin 2 (two) times daily.        . insulin lispro (HUMALOG) 100 UNIT/ML injection Inject 16 Units into the skin 3 (three) times daily before meals.     . IRON PO Take 65 mg by mouth daily.        Marland Kitchen LACTOBACILLUS PO Take 1 capsule by mouth daily.     . valsartan-hydrochlorothiazide (DIOVAN-HCT) 80-12.5 MG per tablet Take 1 tablet by mouth daily.     Marland Kitchen aspirin EC 81 MG EC tablet Take 1 tablet (81 mg total) by mouth daily.  0   . atorvastatin (LIPITOR) 40 MG tablet Take 1 tablet (40 mg total) by mouth nightly. 30 tablet 0       Past History     Past Medical History:  Past Medical History   Diagnosis Date   . Asthma without status asthmaticus      childhood   . Diabetes mellitus type II, controlled    . GERD (gastroesophageal reflux disease)      occ.   . Anemia    .  Vision abnormalities    . DKA (diabetic ketoacidoses)        Past Surgical History:  Past Surgical History   Procedure Laterality Date   . Cesarean section     . Hysteroscopy, endometrial ablation, thermachoice  10/08/2012     Procedure: HYSTEROSCOPY, ENDOMETRIAL ABLATION, THERMACHOICE;  Surgeon: Larae Grooms, MD;  Location: [facility];  Service: Gynecology;  Laterality: N/A;  thermachoice       Family History:  No family history on file.    Social History:  History   Substance Use Topics   . Smoking status: Never Smoker    . Smokeless tobacco: Never Used   . Alcohol Use: No       Allergies:  Allergies   Allergen Reactions   . Penicillins Other (See Comments)     UNKNOWN       Review of Systems     Review of Systems   Constitutional: Negative for fever.   Eyes:        +"hazy" vision   Respiratory: Negative for shortness of breath.    Cardiovascular: Negative for chest pain.   Musculoskeletal:        +right arm weakness at baseline  No lower  extremity weakness   Neurological:        +speech changes   All other systems reviewed and are negative.       Physical Exam   BP 133/91 mmHg  Pulse 90  Temp(Src) 97 F (36.1 C) (Oral)  Resp 18  Ht 5\' 4"  (1.626 m)  Wt 93.35 kg  BMI 35.31 kg/m2  SpO2 98%    Physical Exam   Constitutional: She is oriented to person, place, and time. She appears well-developed and well-nourished.   HENT:   Head: Normocephalic and atraumatic.   Eyes: EOM are normal. Pupils are equal, round, and reactive to light.   Neck: Normal range of motion. Neck supple.   Cardiovascular: Regular rhythm and normal heart sounds.    Tachycardiac   Pulmonary/Chest: Effort normal and breath sounds normal. She has no wheezes.   Abdominal: Soft. There is no tenderness.   Musculoskeletal: Normal range of motion.   Neurological: She is alert and oriented to person, place, and time.   4+/5 strength R upper extremity, 5/5 remainder   Skin: Skin is warm and dry.   Psychiatric: She has a normal mood and affect. Her behavior is normal.   Nursing note and vitals reviewed.      Diagnostic Study Results     Labs -     Results     Procedure Component Value Units Date/Time    Hemolysis index [id number] Collected:  12/23/14 0526     Hemolysis Index 1 Updated:  12/23/14 0959    Lipid panel (Fasting) [id number]  (Abnormal) Collected:  12/23/14 0526    Specimen Information:  Blood Updated:  12/23/14 0959     Cholesterol 236 (H) mg/dL      Triglycerides 123 mg/dL      HDL 41 mg/dL      LDL Calculated 170 (H) mg/dL      VLDL Cholesterol Cal 25 mg/dL      CHOL/HDL Ratio 5.8     Hemoglobin A1c [id number]  (Abnormal) Collected:  12/23/14 0526    Specimen Information:  Blood Updated:  12/23/14 0959     Hemoglobin A1C 13.8 (H) %     Glucose Whole Blood - POCT [id number]  (Abnormal) Collected:  12/23/14 [id number]  POCT - Glucose Whole blood 181 (H) mg/dL Updated:  12/23/14 0714    Comprehensive metabolic panel [id number]  (Abnormal) Collected:  12/23/14 0526     Specimen Information:  Blood Updated:  12/23/14 0622     Glucose 180 (H) mg/dL      BUN 18.0 mg/dL      Creatinine 1.0 mg/dL      Sodium 135 (L) mEq/L      Potassium 3.5 mEq/L      Chloride 108 mEq/L      CO2 20 (L) mEq/L      CALCIUM 8.4 (L) mg/dL      Protein, Total 5.9 (L) g/dL      Albumin 2.7 (L) g/dL      AST (SGOT) 9 U/L      ALT 6 U/L      Alkaline Phosphatase 68 U/L      Bilirubin, Total 0.5 mg/dL      Globulin 3.2 g/dL      Albumin/Globulin Ratio 0.8 (L)      Anion Gap 7.0     Hemolysis index [id number] Collected:  12/23/14 0526     Hemolysis Index 2 Updated:  12/23/14 0622    GFR [id number] Collected:  12/23/14 0526     EGFR >60.0 Updated:  12/23/14 0622    Protime-INR [id number] Collected:  12/23/14 0526    Specimen Information:  Blood Updated:  12/23/14 0557     PT 13.6 sec      PT INR 1.0      PT Anticoag. Given Within 48 hrs. None     Glucose Whole Blood - POCT [id number] Collected:  12/22/14 2120     POCT - Glucose Whole blood 97 mg/dL Updated:  12/22/14 2150    Troponin I [id number] Collected:  12/22/14 1819    Specimen Information:  Blood Updated:  12/22/14 1854     Troponin I <0.01 ng/mL     Comprehensive Metabolic Panel (CMP) [id number]  (Abnormal) Collected:  12/22/14 1819    Specimen Information:  Blood Updated:  12/22/14 1848     Glucose 239 (H) mg/dL      BUN 22.0 (H) mg/dL      Creatinine 1.3 (H) mg/dL      Sodium 135 (L) mEq/L      Potassium 3.4 (L) mEq/L      Chloride 104 mEq/L      CO2 25 mEq/L      CALCIUM 9.3 mg/dL      Protein, Total 7.1 g/dL      Albumin 3.5 g/dL      AST (SGOT) 10 U/L      ALT 10 U/L      Alkaline Phosphatase 92 U/L      Bilirubin, Total 0.2 mg/dL      Globulin 3.6 g/dL      Albumin/Globulin Ratio 1.0      Anion Gap 6.0     Hemolysis index [id number] Collected:  12/22/14 1819     Hemolysis Index 6 Updated:  12/22/14 1848    GFR [id number] Collected:  12/22/14 1819     EGFR 53.5 Updated:  12/22/14 1848    Venous Blood Gas [id number] Collected:  12/22/14 1819     Specimen Information:  Blood Updated:  12/22/14 1829     pH, Ven 7.345      pCO2, Venous 43.7 mmhg      pO2, Venous 24.7 mmhg      HCO3, Ven 23.2 mEq/L  Venous Total CO2 21.6 mEq/L      Base Excess, Ven -2.0 mEq/L      O2 Sat, Venous 38.3 %      Temperature 37.0      VBG CollectionSite Venous     CBC and differential [id number]  (Abnormal) Collected:  12/22/14 1819    Specimen Information:  Blood / Blood Updated:  12/22/14 1826     WBC 10.59 x10 3/uL      Hgb 11.8 (L) g/dL      Hematocrit 34.0 (L) %      Platelets 240 x10 3/uL      RBC 4.31 x10 6/uL      MCV 78.9 (L) fL      MCH 27.4 (L) pg      MCHC 34.7 g/dL      RDW 12 %      MPV 11.5 fL      Neutrophils 68 %      Lymphocytes Automated 28 %      Monocytes 4 %      Eosinophils Automated 1 %      Basophils Automated 0 %      Immature Granulocyte 0 %      Nucleated RBC 0 /100 WBC      Neutrophils Absolute 7.17 x10 3/uL      Abs Lymph Automated 2.91 x10 3/uL      Abs Mono Automated 0.43 x10 3/uL      Abs Eos Automated 0.06 x10 3/uL      Absolute Baso Automated 0.02 x10 3/uL      Absolute Immature Granulocyte 0.02 x10 3/uL           Radiologic Studies -   Radiology Results (24 Hour)     Procedure Component Value Units Date/Time    MRA Neck W WO Contrast [id number] Collected:  12/22/14 2121    Order Status:  Completed Updated:  12/22/14 2127    Narrative:      MRA OF THE NECK    CLINICAL STATEMENT: Difficulty speaking. Possible stroke. .    TECHNIQUE: The carotid and vertebral arteries in the neck were examined  with 3-D time-of-flight methodology and multiplanar reconstructed images  were created from the source data. Coronal slab 3-D gradient-echo images  were obtained following 12mL of Gadavist administration to assess the  carotid and vertebral arteries. Multiplanar reconstructed images were  created from the source data.    Stenosis was estimated utilizing the residual internal carotid artery  diameter with [place] [employer] Symptomatic Carotid  Endarterectomy Trial  (NASCET) criteria.    FINDINGS: Both common carotid arteries are widely patent.  The carotid  bifurcations are patent and smooth-walled.  The proximal internal  carotid arteries are normal in course and caliber.  The vertebral  arteries are patent and unite to form a normal basilar artery.      Impression:        Unremarkable MRA of the neck.     Raphael Gibney, MD   12/22/2014 9:23 PM      MRA Head WO Contrast [id number] Collected:  12/22/14 2120    Order Status:  Completed Updated:  12/22/14 2125    Narrative:      MRA CIRCLE OF WILLIS    CLINICAL STATEMENT: Difficulty speaking. Now resolved.     COMPARISON: No prior studies are available for comparison.      TECHNIQUE:  3D time-of-flight methodology was utilized to assess the  vessels at the base of  the brain and the major branches of the circle of  Willis. Multiplanar reconstructed images were created from the source  data.    FINDINGS: The distal internal carotid arteries and the basilar artery  are normal in course and caliber. Note is made of a partially azygos  anterior cerebral artery. The proximal portions of the anterior, middle  and posterior cerebral arteries are normal in course and caliber.  No  aneurysms or vascular abnormalities are evident.      Impression:          Unremarkable MRA of the head.    Raphael Gibney, MD   12/22/2014 9:21 PM      MRI Brain W Lottie Dawson Contrast [id number] Collected:  12/22/14 2105    Order Status:  Completed Updated:  12/22/14 2117    Narrative:      MRI BRAIN    CLINICAL STATEMENT: Difficulty speaking.     COMPARISON: The study is compared to a CT of the head performed the same  day.     TECHNIQUE: MRI of the brain was performed utilizing various pulse  sequences and planes including T1, T2,  FLAIR and diffusion weighted  sequences. 10 mL of Gadavist was administered and T1-weighted axial and  coronal sequences performed.    FINDINGS: There is no mass, mass-effect or midline shift. No abnormal  intra  or extra-axial fluid collections are identified. The lateral  ventricles are normal in size and appearance. There is no abnormal  enhancement.  There is no intracranial hemorrhage. Diffusion weighted  sequences and ADC maps demonstrate no restricted diffusion to suggest an  acute infarction.       The paranasal sinuses are clear. Evaluation of the orbits is  unremarkable. Appropriate marrow signal is noted of the osseous  structures including the clivus.      Impression:        No acute intracranial abnormalities.    Raphael Gibney, MD   12/22/2014 9:06 PM      CT Head without Contrast [id number] Collected:  12/22/14 1758    Order Status:  Completed Updated:  12/22/14 1802    Narrative:      CT HEAD    CLINICAL STATEMENT: blurry vision, slurred speech    COMPARISON: No prior studies are available for comparison.     TECHNIQUE: Multiple 5 mm thickness axial images were obtained from the  skull base to the vertex without IV contrast administration.    Note that CT scanning at this site utilizes multiple dose reduction  techniques including automatic exposure control, adjustment of the MAS  and/or KVP according to patient size, and use of iterative  reconstruction technique.    FINDINGS: Gray-white matter junction differentiation is appropriate. No  intracranial hemorrhage is identified. The ventricles are appropriate in  size, shape and are midline. There is no intracranial mass, mass-effect  or midline shift. No abnormal intra or extra-axial fluid collections are  identified.     The visualized paranasal sinuses and mastoid air cells are well-aerated.  The osseous calvarium is intact.      Impression:        No CT evidence of an acute intracranial abnormality.    Raphael Gibney, MD   12/22/2014 5:58 PM        .      Medical Decision Making   I am the first provider for this patient.    I reviewed the vital signs, available nursing notes, past medical history, past  surgical history, family history and social  history.    Vital Signs-Reviewed the patient's vital signs.     No data found.      Pulse Oximetry Analysis -  Normal 99% on RA    Cardiac Monitor:  Rate: 102  Rhythm:  Sinus Tachycardia     EKG:  Interpreted by the EP.   Time Interpreted: 1814   Rate: 106   Rhythm: Sinus Tachycardia    Interpretation: No acute ischemia   Comparison: No prior study is available for comparison.     Old Medical Records: Nursing notes.     Core Measures:    TPA: The eligibility of tPA was considered but not given because pt was outside the window and symptoms had resolved.    Aspirin was administered in the ED at 1807.     ED Course:     6:03 PM - D/w pt plan for labs and waiting for CT results. She is agreeable. She does not have any symptoms at this time.     7:05 PM - Pt reports feeling well, still asymptomatic. She agrees with the plan.     7:14 PM - Discussed pt case with Dr. Particia Jasper, neurology, who recommends admission and MRI and will consult in the AM.    7:17 PM - Discussed patient case with Dr. Steele Sizer, [facility], who agrees with the plan and accepts for admission for Dr Shelton Silvas who admits for pt's PCP.     Provider Notes:   44 y.o. F with 3 episodes of aphasia while at work today. First episode was 8 hours ago. Each lasted a few minutes, then resolved. She has a normal neuro exam at this time--she has 4+/5 strength in the RUE, but states that's her baseline. Will admit for MRI. ASA given.     Dr. Pollie Friar is the primary emergency physician of record.      Diagnosis     Clinical Impression:   1. Aphasia    2. Hyperglycemia        Disposition:   ED Disposition     Observation Admitting Physician: Delila Pereyra [id number]  Diagnosis: Aphasia Marvelous.Norman.3.ICD-9-CM]  Estimated Length of Stay: < 2 midnights  Tentative Discharge Plan?: Home or Self Care [1]  Patient Class: Observation [id number]            _______________________________    Attestations:  This note is prepared by Binnie Kand, acting as Scribe for  Darleene Cleaver, MD.     Darleene Cleaver, MD:  The scribe's documentation has been prepared under my direction and personally reviewed by me in its entirety.  I confirm that the note above accurately reflects all work, treatment, procedures, and medical decision making performed by me.  _______________________________        Rayna Sexton, MD  12/26/14 [phone]

## 2014-12-22 NOTE — H&P (Signed)
ADMISSION HISTORY AND PHYSICAL EXAM    Date: 12/22/2014   Patient Name: Elizabeth Dunlap  Attending Physician: Delila Pereyra, DO      Assessment:     . Aphasia  . Rule out CVA (cerebral vascular accident)  . Anemia of chronic disease  . DM type 2 (diabetes mellitus, type 2)  . Acute kidney injury  . Volume depletion          Plan:    Admit to [facility]/telemetry   MRI/MRA of the brain   MRA of the neck vessels .   Nothing by mouth.    Swallow evaluation at bedside.   Neuro checks every 4 hours.   Aspirin 325 mg daily.   Lipitor 40 mg by mouth daily at bedtime   Fingersticks every before meals and at bedtime and sliding scale coverage with regular insulin as patient is nothing by mouth   Continue home medications.   Neurology consultation with Dr. Maudie Mercury much appreciated   Gastrointestinal and deep vein thrombosis prophylaxis with Protonix and Lovenox   Eventual PT/OT      Chief complaint:   Headache, slurred speech, with aphasia    History of Present Illness:   Elizabeth Dunlap is a 44 y.o. female who presents to the hospital with Headache, slurred speech, with aphasia.  Medical history significant of diabetes mellitus type 2 and hypertension.  Presented to the patient today with his speech changes, episodes of expressive aphasia, but 3 episodes over the last 8 hours.  She reports is also vision changes, bilateral.  Reports also losing her thoughts while she is talking.  Her blood sugar is poorly controlled, has some stressors.  She also reports cramping bilateral lower extremity.  Upon examination, patient's speech back to normal and she reports that the symptoms have resolved.  Denies any lower extremity weakness but report is intermittent right upper extremity weakness which is a baseline for her for many years.  CT scan of the brain without contrast, no evidence of any acute intracranial abnormality.  Blood work, complete metabolic panel showing BUN/creatinine 22.0/1.3, potassium 3.4.  No previous kidney  function to be compared.    Past Medical History:     Past Medical History   Diagnosis Date   . Asthma without status asthmaticus      childhood   . Diabetes mellitus type II, controlled    . GERD (gastroesophageal reflux disease)      occ.   . Anemia    . Vision abnormalities    . DKA (diabetic ketoacidoses)        Past Surgical History:     Past Surgical History   Procedure Laterality Date   . Cesarean section     . Hysteroscopy, endometrial ablation, thermachoice  10/08/2012     Procedure: HYSTEROSCOPY, ENDOMETRIAL ABLATION, THERMACHOICE;  Surgeon: Larae Grooms, MD;  Location: [facility];  Service: Gynecology;  Laterality: N/A;  thermachoice       Family History:   No family history on file.    Social History:     History     Social History   . Marital Status: Single     Spouse Name: N/A   . Number of Children: N/A   . Years of Education: N/A     Social History Main Topics   . Smoking status: Never Smoker    . Smokeless tobacco: Never Used   . Alcohol Use: No   . Drug Use: No   .  Sexual Activity: Not on file     Other Topics Concern   . Not on file     Social History Narrative       Allergies:     Allergies   Allergen Reactions   . Penicillins Other (See Comments)     UNKNOWN       Medications:     Current Discharge Medication List      CONTINUE these medications which have NOT CHANGED    Details   Cholecalciferol (VITAMIN D PO) Take 5,000 Units by mouth every other day.         Insulin Glargine (LANTUS SC) Inject 40 Units into the skin 2 (two) times daily.         insulin lispro (HUMALOG) 100 UNIT/ML injection Inject 16 Units into the skin 3 (three) times daily before meals.      IRON PO Take 65 mg by mouth daily.         LACTOBACILLUS PO Take 1 capsule by mouth daily.      valsartan-hydrochlorothiazide (DIOVAN-HCT) 80-12.5 MG per tablet Take 1 tablet by mouth daily.      acetaminophen-codeine (TYLENOL #3) 300-30 MG per tablet Take 1 tablet by mouth every 6 (six) hours as needed for Pain.  Qty: 30 tablet,  Refills: 0      diazepam (VALIUM) 2 MG tablet Take 1 tablet (2 mg total) by mouth every 8 (eight) hours as needed for Anxiety.  Qty: 12 tablet, Refills: 0      Lactobacillus (ACIDOPHILUS PO) Take by mouth.      Multiple Vitamins-Minerals (MULTIVITAMIN WITH MINERALS) tablet Take 1 tablet by mouth daily.             Primary care physician:   Cheryle Horsfall, MD      Review of Systems:   General:  Fever/chills: Absent   HEENT: Positive Headache,blurred vision,sore throat.  Positive slurred speech and aphasia  Respiratory: Cough/ SOB- Absent  Cardiac: Chest pain/SOB- absent  Gastrointestinal: Nausea/vomiting/diarrhea/constipation-Absent, Abdominal pain- absent  Genitourinary: No burning micturition.   Musculoskeletal:Negative for - joint pain, muscular weakness or swelling   Dermatologic: no rash   CNS/Neurological: Positive headache,weakness,blurred vision , positive slurred speech and aphasia    Physical Exam:   BP 156/86 mmHg  Pulse 93  Temp(Src) 97.2 F (36.2 C) (Oral)  Resp 18  Ht 1.626 m (5\' 4" )  Wt 94.348 kg (208 lb)  BMI 35.69 kg/m2  SpO2 98%      General appearance - alert, well appearing, and in no distress  Eyes - pupils equal and reactive, extraocular eye movements intact  Ears - hearing intact  Nose - normal, no erythema, discharge   Mouth - oropharynx clear , mucous membranes moist  Neck - supple, no significant adenopathy  Chest - clear to auscultation, no wheezes, rales or rhonchi  Heart - regular rate and rhythm, normal S1, S2  Abdomen - soft, nontender, nondistended, no masses or organomegaly  Neurological - alert, oriented, normal speech, no focal findings or movement disorder noted  Musculoskeletal - no joint tenderness, deformity or swelling  Extremities - peripheral pulses normal, no pedal edema, no clubbing or cyanosis  Skin - normal coloration and turgor, no rashes, no suspicious skin lesions noted    Labs:     Results     Procedure Component Value Units Date/Time    Glucose Whole  Blood - POCT [id number] Collected:  12/22/14 2120     POCT - Glucose Whole blood  97 mg/dL Updated:  12/22/14 2150    Troponin I [id number] Collected:  12/22/14 1819    Specimen Information:  Blood Updated:  12/22/14 1854     Troponin I <0.01 ng/mL     Comprehensive Metabolic Panel (CMP) [id number]  (Abnormal) Collected:  12/22/14 1819    Specimen Information:  Blood Updated:  12/22/14 1848     Glucose 239 (H) mg/dL      BUN 22.0 (H) mg/dL      Creatinine 1.3 (H) mg/dL      Sodium 135 (L) mEq/L      Potassium 3.4 (L) mEq/L      Chloride 104 mEq/L      CO2 25 mEq/L      CALCIUM 9.3 mg/dL      Protein, Total 7.1 g/dL      Albumin 3.5 g/dL      AST (SGOT) 10 U/L      ALT 10 U/L      Alkaline Phosphatase 92 U/L      Bilirubin, Total 0.2 mg/dL      Globulin 3.6 g/dL      Albumin/Globulin Ratio 1.0      Anion Gap 6.0     Hemolysis index [id number] Collected:  12/22/14 1819     Hemolysis Index 6 Updated:  12/22/14 1848    GFR [id number] Collected:  12/22/14 1819     EGFR 53.5 Updated:  12/22/14 1848    Venous Blood Gas [id number] Collected:  12/22/14 1819    Specimen Information:  Blood Updated:  12/22/14 1829     pH, Ven 7.345      pCO2, Venous 43.7 mmhg      pO2, Venous 24.7 mmhg      HCO3, Ven 23.2 mEq/L      Venous Total CO2 21.6 mEq/L      Base Excess, Ven -2.0 mEq/L      O2 Sat, Venous 38.3 %      Temperature 37.0      VBG CollectionSite Venous     CBC and differential [id number]  (Abnormal) Collected:  12/22/14 1819    Specimen Information:  Blood / Blood Updated:  12/22/14 1826     WBC 10.59 x10 3/uL      Hgb 11.8 (L) g/dL      Hematocrit 34.0 (L) %      Platelets 240 x10 3/uL      RBC 4.31 x10 6/uL      MCV 78.9 (L) fL      MCH 27.4 (L) pg      MCHC 34.7 g/dL      RDW 12 %      MPV 11.5 fL      Neutrophils 68 %      Lymphocytes Automated 28 %      Monocytes 4 %      Eosinophils Automated 1 %      Basophils Automated 0 %      Immature Granulocyte 0 %      Nucleated RBC 0 /100 WBC      Neutrophils Absolute  7.17 x10 3/uL      Abs Lymph Automated 2.91 x10 3/uL      Abs Mono Automated 0.43 x10 3/uL      Abs Eos Automated 0.06 x10 3/uL      Absolute Baso Automated 0.02 x10 3/uL      Absolute Immature Granulocyte 0.02 x10 3/uL  Rads:   Ct Head Without Contrast    12/22/2014    No CT evidence of an acute intracranial abnormality.  Raphael Gibney, MD  12/22/2014 5:58 PM     Mra Head Wo Contrast    12/22/2014      Unremarkable MRA of the head.  Raphael Gibney, MD  12/22/2014 9:21 PM     Mra Neck W Wo Contrast    12/22/2014    Unremarkable MRA of the neck.   Raphael Gibney, MD  12/22/2014 9:23 PM     Mri Brain W Wo Contrast    12/22/2014    No acute intracranial abnormalities.  Raphael Gibney, MD  12/22/2014 9:06 PM           Delton Coombes, MD FACP  12/22/2014   10:11 PM

## 2014-12-23 DIAGNOSIS — R4701 Aphasia: Secondary | ICD-10-CM

## 2014-12-23 LAB — LIPID PANEL
Cholesterol / HDL Ratio: 5.8
Cholesterol / HDL Ratio: 5.6
Cholesterol: 236 mg/dL — ABNORMAL HIGH (ref 0–199)
Cholesterol: 240 mg/dL — ABNORMAL HIGH (ref 0–199)
HDL: 41 mg/dL (ref >40)
HDL: 43 mg/dL (ref >40)
LDL Calculated: 170 mg/dL — ABNORMAL HIGH (ref 0–99)
LDL Calculated: 172 mg/dL — ABNORMAL HIGH (ref 0–99)
Triglycerides: 123 mg/dL (ref 34–149)
Triglycerides: 125 mg/dL (ref 34–149)
VLDL Calculated: 25 mg/dL (ref 10–40)
VLDL Calculated: 25 mg/dL (ref 10–40)

## 2014-12-23 LAB — COMPREHENSIVE METABOLIC PANEL
ALT: 6 U/L (ref 0–55)
AST (SGOT): 9 U/L (ref 5–34)
Albumin/Globulin Ratio: 0.8 — ABNORMAL LOW (ref 0.9–2.2)
Albumin: 2.7 g/dL — ABNORMAL LOW (ref 3.5–5.0)
Alkaline Phosphatase: 68 U/L (ref 37–106)
Anion Gap: 7 (ref 5.0–15.0)
BUN: 18 mg/dL (ref 7.0–19.0)
Bilirubin, Total: 0.5 mg/dL (ref 0.2–1.2)
CO2: 20 mEq/L — ABNORMAL LOW (ref 22–29)
Calcium: 8.4 mg/dL — ABNORMAL LOW (ref 8.5–10.5)
Chloride: 108 mEq/L (ref 100–111)
Creatinine: 1 mg/dL (ref 0.6–1.0)
Globulin: 3.2 g/dL (ref 2.0–3.6)
Glucose: 180 mg/dL — ABNORMAL HIGH (ref 70–100)
Potassium: 3.5 mEq/L (ref 3.5–5.1)
Protein, Total: 5.9 g/dL — ABNORMAL LOW (ref 6.0–8.3)
Sodium: 135 mEq/L — ABNORMAL LOW (ref 136–145)

## 2014-12-23 LAB — ECG 12-LEAD
Atrial Rate: 106 {beats}/min
P Axis: 26 degrees
P-R Interval: 140 ms
Q-T Interval: 336 ms
QRS Duration: 70 ms
QTC Calculation (Bezet): 446 ms
R Axis: 6 degrees
T Axis: 1 degrees
Ventricular Rate: 106 {beats}/min

## 2014-12-23 LAB — PT/INR
PT INR: 1 (ref 0.9–1.1)
PT: 13.6 s (ref 12.6–15.0)

## 2014-12-23 LAB — HEMOLYSIS INDEX
Hemolysis Index: 2 (ref 0–18)
Hemolysis Index: 1 (ref 0–18)
Hemolysis Index: 0 (ref 0–18)

## 2014-12-23 LAB — GFR: EGFR: >60

## 2014-12-23 LAB — HEMOGLOBIN A1C: Hemoglobin A1C: 13.8 % — ABNORMAL HIGH (ref 0.0–6.0)

## 2014-12-23 LAB — GLUCOSE WHOLE BLOOD - POCT: Whole Blood Glucose POCT: 181 mg/dL — ABNORMAL HIGH (ref 70–100)

## 2014-12-23 MED ORDER — HYDROCHLOROTHIAZIDE 12.5 MG PO TABS
12.5000 mg | ORAL_TABLET | Freq: Every day | ORAL | Status: DC
Start: 2014-12-24 — End: 2014-12-23

## 2014-12-23 MED ORDER — VALSARTAN 80 MG PO TABS
80.0000 mg | ORAL_TABLET | Freq: Once | ORAL | Status: AC
Start: 2014-12-23 — End: 2014-12-23
  Administered 2014-12-23: 80 mg via ORAL
  Filled 2014-12-23: qty 1

## 2014-12-23 MED ORDER — VALSARTAN 80 MG PO TABS
80.0000 mg | ORAL_TABLET | Freq: Every day | ORAL | Status: DC
Start: 2014-12-24 — End: 2014-12-23

## 2014-12-23 MED ORDER — ASPIRIN 81 MG PO TBEC
81.0000 mg | DELAYED_RELEASE_TABLET | Freq: Every day | ORAL | Status: DC
Start: 2014-12-23 — End: 2016-04-03

## 2014-12-23 MED ORDER — ATORVASTATIN CALCIUM 40 MG PO TABS
40.0000 mg | ORAL_TABLET | Freq: Every evening | ORAL | Status: DC
Start: 2014-12-23 — End: 2016-04-03

## 2014-12-23 NOTE — Discharge Summary (Signed)
DISCHARGE SUMMARY    Date Time: 12/23/2014 10:12 AM  Patient Name: Elizabeth Dunlap  Attending Physician: Delila Pereyra, DO    Date of Admission:   12/22/2014    Date of Discharge:   12/23/2014    Reason for Admission:   Aphasia [R47.01]  Hyperglycemia [R73.9]    Discharge Dx:   Marland Kitchen Aphasia ? TIA- resolved- MRI negative  .  Hypertension   . Anemia of chronic disease  . DM type 2 (diabetes mellitus, type 2)  . Acute kidney injury- resolved   . Volume depletion- resolved  . Hyperlipidemia     Consultations:   Ernie Avena, MD    Discharge Medications:        Discharge Medication List      Taking          aspirin EC 81 MG EC tablet   Dose:  81 mg   Take 1 tablet (81 mg total) by mouth daily.       atorvastatin 40 MG tablet   Dose:  40 mg   Commonly known as:  LIPITOR   Take 1 tablet (40 mg total) by mouth nightly.       insulin lispro 100 UNIT/ML injection   Dose:  16 Units   Commonly known as:  HumaLOG   Inject 16 Units into the skin 3 (three) times daily before meals.       IRON PO   Dose:  65 mg   - Take 65 mg by mouth daily.     -        LACTOBACILLUS PO   Dose:  1 capsule   Take 1 capsule by mouth daily.       LANTUS SC   Dose:  40 Units   - Inject 40 Units into the skin 2 (two) times daily.     -        valsartan-hydrochlorothiazide 80-12.5 MG per tablet   Dose:  1 tablet   Commonly known as:  DIOVAN-HCT   Take 1 tablet by mouth daily.       VITAMIN D PO   Dose:  5000 Units   - Take 5,000 Units by mouth every other day.     [facility] Course:   Please see admission H and P and consult for details.  Briefly, patient is a 44 year old female presented with slurred speech with aphasia. she was admitted to rule out stroke.  MRI of the brain and MRA head and neck, were unremarkable. Patient's symptoms resolved completely and she was ambulating without any problem.  Patient was noted to have a elevated LDL.  She was discharged with aspirin along with a statin and advised to follow with neurology.  Patient  was also dehydrated with acute renal insufficiency which resolved after the hydration.  Other issues remained stable and he was discharged home in stable condition. She needs to follow with the primary care physician for diabetic management.    Laboratory Data     CBC    Recent Labs  Lab 12/22/14  1819   WBC 10.59   HGB 11.8*   HEMATOCRIT 34.0*   PLATELETS 240   MCV 78.9*   NEUTROPHILS 68       CMP    Recent Labs  Lab 12/23/14  0526 12/22/14  1819   SODIUM 135* 135*   POTASSIUM 3.5 3.4*   CHLORIDE 108 104   CO2  20* 25   BUN 18.0 22.0*   CREATININE 1.0 1.3*   GLUCOSE 180* 239*   CALCIUM 8.4* 9.3   PROTEIN, TOTAL 5.9* 7.1   ALBUMIN 2.7* 3.5   AST (SGOT) 9 10   ALT 6 10   ALKALINE PHOSPHATASE 68 92   BILIRUBIN, TOTAL 0.5 0.2       Lipid panel    Recent Labs  Lab 12/23/14  0526   CHOLESTEROL 236*   TRIGLYCERIDES 123   HDL 41   LDL CALCULATED 170*         Recent Labs  Lab 12/23/14  0526   HEMOGLOBIN A1C 13.8*     Cardiac enzymes    Recent Labs  Lab 12/22/14  1819   TROPONIN I <0.01       Recent Labs  Lab 12/23/14  0526   PT 13.6   PT INR 1.0     All radiology result for current encounter  Ct Head Without Contrast    12/22/2014    No CT evidence of an acute intracranial abnormality.  Raphael Gibney, MD  12/22/2014 5:58 PM     Mra Head Wo Contrast    12/22/2014      Unremarkable MRA of the head.  Raphael Gibney, MD  12/22/2014 9:21 PM     Mra Neck W Wo Contrast    12/22/2014    Unremarkable MRA of the neck.   Raphael Gibney, MD  12/22/2014 9:23 PM     Mri Brain W Wo Contrast    12/22/2014    No acute intracranial abnormalities.  Raphael Gibney, MD  12/22/2014 9:06 PM       Physical Exam:   BP 133/91 mmHg  Pulse 90  Temp(Src) 97 F (36.1 C) (Oral)  Resp 18  Ht 1.626 m (5\' 4" )  Wt 93.35 kg (205 lb 12.8 oz)  BMI 35.31 kg/m2  SpO2 98%    General appearance - alert, and in no distress  HEENT: Normocephalic,atraumatic, pupil equal  Neck - supple  Chest - clear to auscultation  Heart - normal rate and regular rhythm  Abdomen  - bowel sounds normal, soft, non distended  Extremities - no pedal edema  Neurological - Alert      Discharge condition:   Stable     Discharge  Diet :    diabetic cardiac diet    Discharge Instructions:   Follow up:     Cheryle Horsfall, MD  [address]  [place] [address]  [phone]    Schedule an appointment as soon as possible for a visit in 1 week      Ernie Avena, [address]  [place] [address]  [phone]    Schedule an appointment as soon as possible for a visit in 2 weeks  neurology        Delila Pereyra, DO  12/23/2014  10:12 AM  Time spent 35 minutes

## 2014-12-23 NOTE — OT Eval Note (Signed)
[facility]   Occupational Therapy Evaluation and Treatment    Patient: Elizabeth Dunlap     MRN#: [id number]   Unit: [facility]  Bed: [id number]    Time of treatment: Time Calculation  OT Received On: 12/23/14  Start Time: [id number]  Stop Time: 1110  Time Calculation (min): 26 min    Eval: 10 minutes  Treat 16 minutes    Consult received for Hubbard Hartshorn for OT Evaluation and Treatment.  Patient's medical condition is appropriate for Occupational therapy intervention at this time.    Activity Orders: OT eval and treat  Precautions and Contraindications:   Precautions  Weight Bearing Status: no restrictions    Medical Diagnosis: Aphasia [R47.01]  Hyperglycemia [R73.9]    History of Present Illness: Elizabeth Dunlap is a 44 y.o. female admitted on 12/22/2014 with episodes of slurred speech and expressive aphasia.     Patient Active Problem List   Diagnosis   . Aphasia   . Rule out CVA (cerebral vascular accident)   . Anemia of chronic disease   . DM type 2 (diabetes mellitus, type 2)   . Acute kidney injury   . Volume depletion        Past Medical/Surgical History:  Past Medical History   Diagnosis Date   . Asthma without status asthmaticus      childhood   . Diabetes mellitus type II, controlled    . GERD (gastroesophageal reflux disease)      occ.   . Anemia    . Vision abnormalities    . DKA (diabetic ketoacidoses)       Past Surgical History   Procedure Laterality Date   . Cesarean section     . Hysteroscopy, endometrial ablation, thermachoice  10/08/2012     Procedure: HYSTEROSCOPY, ENDOMETRIAL ABLATION, THERMACHOICE;  Surgeon: Larae Grooms, MD;  Location: [facility];  Service: Gynecology;  Laterality: N/A;  thermachoice         X-Rays/Tests  Lab Results   Component Value Date/Time    HGB 11.8* 12/22/2014 06:19 PM    HEMATOCRIT 34.0* 12/22/2014 06:19 PM    POTASSIUM 3.5 12/23/2014 05:26 AM    SODIUM 135* 12/23/2014 05:26 AM    PT INR 1.0 12/23/2014 05:26 AM    TROPONIN I <0.01  12/22/2014 06:19 PM     MRA Head/Neck, MRI Brain (-)    Social History:  Prior Level of Function  Prior level of function: Independent with ADLs, Ambulates independently  Baseline Activity Level: Community ambulation  Driving: independent  Dressing - Upper Body: independent  Dressing - Lower Body: independent  Cooking: Yes  Feeding: independent  Bathing: independent  Grooming: independent  Toileting: independent  Employment: FT    Home Living Arrangements  Living Arrangements: Family members, Children  Type of Home: House    Subjective: Subjective: "I feel like I've been talking better." "I think a lot of it had to do with sitting down at work for this 9 week training. I don't get to move like I'm used to."   Patient is agreeable to participation in the therapy session. Nursing clears patient for therapy.   Patient Goal: Keep my sugars under control so nothing happens again   Pain Assessment  Pain Assessment: No/denies pain      Objective:      Observation of Patient/Vital Signs: VSS      Patient received in bed with Telemetry in place.    Cognitive Status  and Neuro Exam:  Cognition/Neuro Status  Arousal/Alertness: Appropriate responses to stimuli  Attention Span: Appears intact  Orientation Level: Oriented X4  Memory: Appears intact  Following Commands: independent  Safety Awareness: independent  Insights: Fully aware of deficits  Behavior: calm, cooperative, attentive  Motor Planning: intact  Coordination: intact  Hand Dominance: right handed  Neuro Status  Behavior: calm, cooperative, attentive  Motor Planning: intact  Coordination: intact  Hand Dominance: right handed       Musculoskeletal Examination  Gross ROM  Right Upper Extremity ROM: within functional limits  Left Upper Extremity ROM: within functional limits  Gross Strength  Right Upper Extremity Strength: within functional limits  Left Upper Extremity Strength: within functional limits       Sensory/Oculomotor Examination  Sensory  Auditory:  intact  Tactile - Light Touch: intact  Visual Acuity: intact         Activities of Daily Living  Self-care and Home Management  Eating: Independent  Grooming: Independent  Bathing: Independent  UB Dressing: Independent  LB Dressing: Independent  Toileting: Independent  Functional Transfers: Independent    Functional Mobility:  Mobility and Transfers  Supine to Sit: Independent  Sit to Supine: Independent  Sit to Stand: Independent  Bed to Chair: Independent  Functional Mobility/Ambulation: Independent     Geophysical data processor Sitting Balance: good  Dyanamic Sitting Balance: good    Participation and Activity Tolerance  Participation and Endurance  Participation Effort: excellent  Endurance: Tolerates < 10 min exercise, no significant change in vital signs    G codes: not indicated                 Educated the patient to role of occupational therapy, plan of care, goals of therapy and discharge instructions. Patient verbalized understanding.      Patient educated on stroke signs and symptoms.   Patient was able to verbalize understanding. Patient does not need further reinforcement.      Patient left in bed with call bell within reach. RN notified of session outcome    Assessment/Treatment: Elizabeth Dunlap is a 44 y.o. female admitted on 12/22/2014. Pt presents with Assessment: Appears to be at baseline for mobility, Appears to be at baseline for ADL's Pt would benefit from skilled Occupational Therapy to address stress management. Patient was educated on stroke signs and symptoms and importance of coming into hospital to ensure no further treatment is needed. Patient ambulated ~200 feet without difficulties and was able to march in place simulating stairs without UE support. Patient educated on strategies to increase movement during work training including standing in back of room, performing ankle pumps, seated march and chair push ups. Also talk about healthy snacks ie apples for natural caffeine, and chewing  gum for increased focus. Taught patient deep breathing with emphasis on exhaling longer than inhale for relaxation. Patient with no further OT needs, questions or concerns at this time. Will D/C OT. Patient agreeable.      Rehabilitation Potential: Prognosis: Good    Plan: OT Frequency Recommended: one time visit   Treatment Interventions: Patient/Family training     Risks/benefits/POC discussed with patient    Goals: Time For Goal Achievement: 1 visit              Executive Fucntion Goals  Other Goal: Patient will verbalize understanding of common stroke symptoms  (Goal met)  Other Goal #2: Patient will verbalize understanding of stress management strategies taught (Goal Met)  Discharge Recommendation: Home with no needs     Ranae Pila, OTR/L [id number]  12/23/2014 11:25 AM

## 2014-12-23 NOTE — Plan of Care (Signed)
Problem: Occupational Therapy  Goal: Patient condition is improving per Occupational Therapy Treatment Plan  Outcome: Completed Date Met:  12/23/14  Discharge Recommendation: Home with no needs        OT Frequency Recommended: one time visit     Is PT evaluation indicated at this time? No, the patient does not require a PT evaluation.    Early/Progressive Mobility Protocol Level: Independent  (Please See Therapy Evaluation for device and assistance level needed)    Treatment Interventions: Patient/Family training           Goal Formulation: Patient  Time For Goal Achievement: 1 visit              Executive Fucntion Goals  Other Goal: Patient will verbalize understanding of common stroke symptoms  (Goal met)  Other Goal #2: Patient will verbalize understanding of stress management strategies taught (Goal Met)

## 2014-12-23 NOTE — Plan of Care (Signed)
Problem: Health Promotion  Goal: Knowledge - health resources  Extent of understanding and conveyed about healthcare resources.   Outcome: Progressing  The patient's learning abilities have been assessed. Today's individualized plan of care to discharge to home with a Lipitor prescription was discussed with the patient and agree to it. Patient demonstrates understanding of disease process, treatment plan, medications and consequences of noncompliance. All questions and concerns were addressed.    Pt ambulated in front of MD and RN, steady gait, no dizziness or diplopia reported. No physical or occupational therapy needs.

## 2014-12-23 NOTE — Progress Notes (Signed)
Writer spoke with pt today. Pt AOx4, self-care and for d/c today. Pt says she will call PCP to set follow up appointment. Pt refusing wheelchair Lucianne Lei and wants to drive herself home.    [id number] Marianna Fuss, RN CM  Case Management  [facility]  [phone]

## 2014-12-23 NOTE — Plan of Care (Signed)
Problem: Day of Admission- Stroke  Goal: Neurological status is stable or improving  The patient and care giver's learning abilities have been assessed. Today's individualized plan of care to Patient safety,Neurocheck,IVF , potassium IV and pain management.POC was discussed with the patient and care giver and agree to it. Patient and care giver demonstrates understanding of disease process, treatment plan, medications and consequences of noncompliance. All questions and concerns were addressed.

## 2014-12-23 NOTE — Discharge Instructions (Signed)
Discharge instructions: Transient Ischemic Attack (TIA)  You have been diagnosed with a TIA, also known as a mini stroke. It is more accurately characterized as a "warning stroke," a warning you should take seriously.  During a TIA, a blood clot stops blood flow to a part of your brain for a short time.  Symptoms of a TIA happen fast and can last only a few minutes. When a TIA is over, it usually causes no permanent injury to the brain.    Follow-up After Discharge   Most likely there are specific follow up instructions listed in another section of these discharge instructions. If we have not already scheduled a follow-up appointment with your physician (Neurologist or (PCP) Primary Care Physician),  a good rule of thumb is to call your PCP as soon as you are home to make an appointment to be seen within 3 - 5 days of discharge.  If you do not have a PCP, please call [phone] for assistance 24 hours a day, 7 days a week.    Medications Prescribed at Discharge  Take your medications as prescribed, listed on the Medication Reconciliation Discharge Home List    Risk Factors for Stroke   Once you've had a stroke, you're at greater risk for another one. Listed below are some other factors that can increase your risk for another stroke:  . High blood pressure  . High blood cholesterol  . Cigarette or cigar smoking  . Diabetes  . Carotid or other artery disease  . Atrial fibrillation, atrial flutter, or other heart disease  . Physical inactivity  . Obesity  . Certain blood disorders (such as sickle cell anemia)  . Excessive alcohol use  . Abuse of illegal drugs  . Race  . Gender  . Diet high in salty, fried, or greasy foods    Stroke Warning Signs  . Sudden numbness or weakness of the face, arm or leg, especially on one side of the body  . Sudden confusion, trouble speaking or understanding  . Sudden trouble seeing in one or both eyes  . Sudden trouble walking, dizziness, loss of balance or coordination  . Sudden,  severe headache with no known cause    If you or someone with you has one or more of these signs, don't delay! Call 9-1-1 immediately.   . Check the time so you'll know when the first symptoms appeared  . Take immediate action  The treatment for stroke must be given within the first three hours for certain patients.    Activation of Emergency Medical System - Call 9-1-1 or get to the hospital fast.    Think - F.A.S.T.     Use the following tool to help you recognize stroke symptoms FAST:    F = Face  Ask the person to smile. Does one side of the face droop?    A = Arms  Ask the person to raise both arms. Does one arm drift downward?    S = Speech Ask the person to repeat a simple phrase.Does the speech sound slurred?    T = Time  If you observe any of these signs, then it's time to call 9-1-1

## 2014-12-23 NOTE — Progress Notes (Signed)
Pt declined wheelchair, safely ambulated off of the unit w/ belongings and d/c papers. Telemetry box and IV access removed. Verbalized understanding of d/c instructions. Pt to drive herself home.

## 2014-12-23 NOTE — Consults (Signed)
[facility] Consultation Note                                       Date Time: 12/23/2014 9:08 AM  Patient Name: Elizabeth Dunlap  Requesting Physician: Delila Pereyra, DO  Date of Admission: 12/22/2014    CC: Speech changes           Assessment:   Transient episodes of language deficit in a 44 y.o. female with a PMH significant for "poorly controlled" DM, HTN, and HLD. Cannot exclude TIA given multiple poorly controlled risk factors. Other possible etiologies include metabolic in the setting of hyperglycemia vs complex migraine vs less likely seizure.     Patient Active Problem List   Diagnosis   . Aphasia   . Rule out CVA (cerebral vascular accident)   . Anemia of chronic disease   . DM type 2 (diabetes mellitus, type 2)   . Acute kidney injury   . Volume depletion          Plan:   -Continue ASA and statin management (Chol 236, LDL 170)  -Aggressive BG and HTN management per PCP  -Would consider an outpatient echocardiogram  -Likely OK to discharge from a neuro standpoint. Can follow-up with [place] in 3-4 weeks.    Neurology attending addnedum  I reviewed the chart, imaging and labs on the above patient.  I also conferred with the midlevel and agree with the findings and plan outlined above.    Lanice Shirts, MD  [facility]      HPI   Elizabeth Dunlap is a 44 y.o. female with a PMH significant for "poorly controlled" DM, HTN and HLD who presented last night following several episodes of transient language deficit and confusion. She endorses 3 of these episodes that lasted a few minutes each. Reports she was loosing her train of thought, slurring her words and staring. She states that "she couldn't get her thoughts together while talking." She was told by coworkers that she "did not sound like herself." She denies any associated dizziness, nausea, vomiting, diplopia. No slurred speech, no focal weakness, or paresthesias. No chest pain/SOB, or palpitation. No fever or chills. She did  endorse a mild bifrontal headache upon waking yesterday which is not abnormal for her. She was seen by her PCP and found to have BS in the 400's. Pt states she has been under some stress due to buying a house recently and her sugar has not been well controlled. She has had some heartburn and lower extremity cramping which she thinks may be related to that and thinks today's sx may also be related.  MRI/MRA were negative for any acute abnormality.      Past Medical Hx     Past Medical History   Diagnosis Date   . Asthma without status asthmaticus      childhood   . Diabetes mellitus type II, controlled    . GERD (gastroesophageal reflux disease)      occ.   . Anemia    . Vision abnormalities    . DKA (diabetic ketoacidoses)           Past Surgical Hx:     Past Surgical History   Procedure Laterality Date   . Cesarean section     . Hysteroscopy, endometrial ablation, thermachoice  10/08/2012     Procedure: HYSTEROSCOPY, ENDOMETRIAL ABLATION, THERMACHOICE;  Surgeon: Venora Maples,  Dutch Quint, MD;  Location: [facility];  Service: Gynecology;  Laterality: N/A;  thermachoice        Family Medical History:    Mother/father - DM  Mother - CVA    Social Hx     History     Social History   . Marital Status: Single     Spouse Name: N/A   . Number of Children: N/A   . Years of Education: N/A     Occupational History   . Not on file.     Social History Main Topics   . Smoking status: Never Smoker    . Smokeless tobacco: Never Used   . Alcohol Use: No   . Drug Use: No   . Sexual Activity: Not on file     Other Topics Concern   . Not on file     Social History Narrative       Meds     Home :   Prior to Admission medications    Medication Sig Start Date End Date Taking? Authorizing Provider   Cholecalciferol (VITAMIN D PO) Take 5,000 Units by mouth every other day.      Yes [provider]   Insulin Glargine (LANTUS SC) Inject 40 Units into the skin 2 (two) times daily.      Yes [provider]   insulin lispro (HUMALOG)  100 UNIT/ML injection Inject 16 Units into the skin 3 (three) times daily before meals.   Yes [provider]   IRON PO Take 65 mg by mouth daily.      Yes [provider]   LACTOBACILLUS PO Take 1 capsule by mouth daily.   Yes [provider]   valsartan-hydrochlorothiazide (DIOVAN-HCT) 80-12.5 MG per tablet Take 1 tablet by mouth daily.   Yes [provider]   acetaminophen-codeine (TYLENOL #3) 300-30 MG per tablet Take 1 tablet by mouth every 6 (six) hours as needed for Pain. 10/08/12   Larae Grooms, MD   diazepam (VALIUM) 2 MG tablet Take 1 tablet (2 mg total) by mouth every 8 (eight) hours as needed for Anxiety. 01/30/14   Seccurro, Annye English, MD   Lactobacillus (ACIDOPHILUS PO) Take by mouth.    [provider]   Multiple Vitamins-Minerals (MULTIVITAMIN WITH MINERALS) tablet Take 1 tablet by mouth daily.    [provider]      Inpatient :   Current Facility-Administered Medications   Medication Dose Route Frequency   . aspirin  81 mg Oral Daily   . atorvastatin  40 mg Oral Daily   . enoxaparin  40 mg Subcutaneous Daily   . lisinopril-hydrochlorothiazide 10-12.5 mg (ZESTORETIC) combo dose   Oral Daily         Allergies    Penicillins      Review of Systems   All other systems were reviewed and are negative except for that mentioned in the HPI    Physical Exam:   Temp:  [97 F (36.1 C)-98.4 F (36.9 C)] 97 F (36.1 C)  Heart Rate:  [88-110] 90  Resp Rate:  [16-18] 18  BP: (117-157)/(72-98) 133/91 mmHg     Vital Signs:  Reviewed    General: The patient was well developed and well nourished.  No acute distress. Cooperative with the exam  Neck:  no carotid bruits  CVS: RRR, no murmurs, rubs,or gallops  Resp: CTA bilaterally, no retractions  Abd: Soft, nontender  Extremities: no pedal edema, extremities normal in  color    Mental Status: The patient was awake, alert and oriented to person, place, and time.  Affect is normal  Fund of knowledge  appropriate  Recent and remote memory are intact   Attention span and concentration appear normal.  Language function is normal. There is no evidence of aphasia in conversational speech.    Cranial nerves:   -CN II: Visual fields full to bedside confrontation   -CN III, IV, VI: Pupils equal, round, and reactive to light; extraocular movements intact; no ptosis              -CN V: Facial sensation intact in V1 through V3 distributions   -CN VII: Face symmetric   -CN VIII: Hearing intact to conversational speech   -CN IX, X: Palate elevates symmetrically; normal phonation   -CN XI: Symmetric full strength of sternocleidomastoid and trapezius muscles   -CN XII: Tongue protrudes midline    Motor: Muscle tone normal without spasticity or flaccidity. No atrophy.  No fasiculations. No pronator drift.  Strength  R / L    R / L  Deltoid  5 / 5  Hip Flexion 5 / 5  Triceps  5 / 5   Hip extension 5 / 5  Biceps  5 / 5   Knee flexion 5 / 5  Wrist ext 5 / 5  Knee ext 5 / 5  Wrist flexion 5 / 5  Dorsiflexion 5 / 5  FF   5 / 5  Plantar flexion 5 / 5    Sensory:   Light touch intact.  Pinprick intact.  Temperature intact.  Vibration intact.  Proprioception intact.    Reflexes: DTR's are hypoactive bilaterally. Flexor plantars    Coordination: FTN  intact, no truncal ataxia. RAMs intact. No tremors    Gait: Station normal, gait stable   Romberg negative.      Labs:     Results     Procedure Component Value Units Date/Time    Lipid panel [id number] Collected:  12/23/14 0525    Specimen Information:  Blood Updated:  12/23/14 0902    Narrative:      Fasting specimen    Glucose Whole Blood - POCT [id number]  (Abnormal) Collected:  12/23/14 0658     POCT - Glucose Whole blood 181 (H) mg/dL Updated:  12/23/14 0714    Comprehensive metabolic panel [id number]  (Abnormal) Collected:  12/23/14 0526    Specimen Information:  Blood Updated:  12/23/14 0622     Glucose 180 (H) mg/dL      BUN 18.0 mg/dL      Creatinine 1.0 mg/dL      Sodium 135  (L) mEq/L      Potassium 3.5 mEq/L      Chloride 108 mEq/L      CO2 20 (L) mEq/L      CALCIUM 8.4 (L) mg/dL      Protein, Total 5.9 (L) g/dL      Albumin 2.7 (L) g/dL      AST (SGOT) 9 U/L      ALT 6 U/L      Alkaline Phosphatase 68 U/L      Bilirubin, Total 0.5 mg/dL      Globulin 3.2 g/dL      Albumin/Globulin Ratio 0.8 (L)      Anion Gap 7.0     Hemolysis index [id number] Collected:  12/23/14 0526     Hemolysis Index 2 Updated:  12/23/14 0622    GFR [id number]  Collected:  12/23/14 0526     EGFR >60.0 Updated:  12/23/14 0622    Protime-INR [id number] Collected:  12/23/14 0526    Specimen Information:  Blood Updated:  12/23/14 0557     PT 13.6 sec      PT INR 1.0      PT Anticoag. Given Within 48 hrs. None     Lipid panel (Fasting) [id number] Collected:  12/23/14 0526    Specimen Information:  Blood Updated:  12/23/14 0526    Narrative:      Fasting specimen    Hemoglobin A1c [id number] Collected:  12/23/14 0526    Specimen Information:  Blood Updated:  12/23/14 0526    Glucose Whole Blood - POCT [id number] Collected:  12/22/14 2120     POCT - Glucose Whole blood 97 mg/dL Updated:  12/22/14 2150    Troponin I [id number] Collected:  12/22/14 1819    Specimen Information:  Blood Updated:  12/22/14 1854     Troponin I <0.01 ng/mL     Comprehensive Metabolic Panel (CMP) [id number]  (Abnormal) Collected:  12/22/14 1819    Specimen Information:  Blood Updated:  12/22/14 1848     Glucose 239 (H) mg/dL      BUN 22.0 (H) mg/dL      Creatinine 1.3 (H) mg/dL      Sodium 135 (L) mEq/L      Potassium 3.4 (L) mEq/L      Chloride 104 mEq/L      CO2 25 mEq/L      CALCIUM 9.3 mg/dL      Protein, Total 7.1 g/dL      Albumin 3.5 g/dL      AST (SGOT) 10 U/L      ALT 10 U/L      Alkaline Phosphatase 92 U/L      Bilirubin, Total 0.2 mg/dL      Globulin 3.6 g/dL      Albumin/Globulin Ratio 1.0      Anion Gap 6.0     Hemolysis index [id number] Collected:  12/22/14 1819     Hemolysis Index 6 Updated:  12/22/14 1848    GFR  [id number] Collected:  12/22/14 1819     EGFR 53.5 Updated:  12/22/14 1848    Venous Blood Gas [id number] Collected:  12/22/14 1819    Specimen Information:  Blood Updated:  12/22/14 1829     pH, Ven 7.345      pCO2, Venous 43.7 mmhg      pO2, Venous 24.7 mmhg      HCO3, Ven 23.2 mEq/L      Venous Total CO2 21.6 mEq/L      Base Excess, Ven -2.0 mEq/L      O2 Sat, Venous 38.3 %      Temperature 37.0      VBG CollectionSite Venous     CBC and differential [id number]  (Abnormal) Collected:  12/22/14 1819    Specimen Information:  Blood / Blood Updated:  12/22/14 1826     WBC 10.59 x10 3/uL      Hgb 11.8 (L) g/dL      Hematocrit 34.0 (L) %      Platelets 240 x10 3/uL      RBC 4.31 x10 6/uL      MCV 78.9 (L) fL      MCH 27.4 (L) pg      MCHC 34.7 g/dL      RDW 12 %      MPV 11.5 fL  Neutrophils 68 %      Lymphocytes Automated 28 %      Monocytes 4 %      Eosinophils Automated 1 %      Basophils Automated 0 %      Immature Granulocyte 0 %      Nucleated RBC 0 /100 WBC      Neutrophils Absolute 7.17 x10 3/uL      Abs Lymph Automated 2.91 x10 3/uL      Abs Mono Automated 0.43 x10 3/uL      Abs Eos Automated 0.06 x10 3/uL      Absolute Baso Automated 0.02 x10 3/uL      Absolute Immature Granulocyte 0.02 x10 3/uL           Rads:     Results for orders placed or performed during the hospital encounter of 12/22/14   MRI Brain W WO Contrast    Narrative    MRI BRAIN    CLINICAL STATEMENT: Difficulty speaking.     COMPARISON: The study is compared to a CT of the head performed the same  day.     TECHNIQUE: MRI of the brain was performed utilizing various pulse  sequences and planes including T1, T2,  FLAIR and diffusion weighted  sequences. 10 mL of Gadavist was administered and T1-weighted axial and  coronal sequences performed.    FINDINGS: There is no mass, mass-effect or midline shift. No abnormal  intra or extra-axial fluid collections are identified. The lateral  ventricles are normal in size and appearance. There is  no abnormal  enhancement.  There is no intracranial hemorrhage. Diffusion weighted  sequences and ADC maps demonstrate no restricted diffusion to suggest an  acute infarction.       The paranasal sinuses are clear. Evaluation of the orbits is  unremarkable. Appropriate marrow signal is noted of the osseous  structures including the clivus.      Impression    No acute intracranial abnormalities.    Raphael Gibney, MD   12/22/2014 9:06 PM     MRA Head WO Contrast    Narrative    MRA CIRCLE OF WILLIS    CLINICAL STATEMENT: Difficulty speaking. Now resolved.     COMPARISON: No prior studies are available for comparison.      TECHNIQUE:  3D time-of-flight methodology was utilized to assess the  vessels at the base of the brain and the major branches of the circle of  Willis. Multiplanar reconstructed images were created from the source  data.    FINDINGS: The distal internal carotid arteries and the basilar artery  are normal in course and caliber. Note is made of a partially azygos  anterior cerebral artery. The proximal portions of the anterior, middle  and posterior cerebral arteries are normal in course and caliber.  No  aneurysms or vascular abnormalities are evident.      Impression        Unremarkable MRA of the head.    Raphael Gibney, MD   12/22/2014 9:21 PM     CT Head without Contrast    Narrative    CT HEAD    CLINICAL STATEMENT: blurry vision, slurred speech    COMPARISON: No prior studies are available for comparison.     TECHNIQUE: Multiple 5 mm thickness axial images were obtained from the  skull base to the vertex without IV contrast administration.    Note that CT scanning at this site utilizes multiple dose reduction  techniques including automatic exposure control, adjustment of the MAS  and/or KVP according to patient size, and use of iterative  reconstruction technique.    FINDINGS: Gray-white matter junction differentiation is appropriate. No  intracranial hemorrhage is identified. The ventricles  are appropriate in  size, shape and are midline. There is no intracranial mass, mass-effect  or midline shift. No abnormal intra or extra-axial fluid collections are  identified.     The visualized paranasal sinuses and mastoid air cells are well-aerated.  The osseous calvarium is intact.      Impression    No CT evidence of an acute intracranial abnormality.    Raphael Gibney, MD   12/22/2014 5:58 PM         Cammy Copa, FNP-C  Nurse Practitioner  [facility]  Daytime: [phone]  Consult requests: [phone]  After 5:00 pm: [phone]

## 2015-06-02 DIAGNOSIS — E1169 Type 2 diabetes mellitus with other specified complication: Secondary | ICD-10-CM | POA: Insufficient documentation

## 2015-06-02 DIAGNOSIS — E782 Mixed hyperlipidemia: Secondary | ICD-10-CM | POA: Insufficient documentation

## 2016-04-03 ENCOUNTER — Emergency Department
Admission: EM | Admit: 2016-04-03 | Discharge: 2016-04-03 | Disposition: A | Discharge status: Home / Self Care | Payer: BC Managed Care – PPO | Attending: Emergency Medicine | Admitting: Emergency Medicine

## 2016-04-03 ENCOUNTER — Emergency Department: Payer: BC Managed Care – PPO

## 2016-04-03 DIAGNOSIS — E119 Type 2 diabetes mellitus without complications: Secondary | ICD-10-CM | POA: Insufficient documentation

## 2016-04-03 DIAGNOSIS — R739 Hyperglycemia, unspecified: Secondary | ICD-10-CM

## 2016-04-03 DIAGNOSIS — K529 Noninfective gastroenteritis and colitis, unspecified: Secondary | ICD-10-CM | POA: Insufficient documentation

## 2016-04-03 DIAGNOSIS — J45909 Unspecified asthma, uncomplicated: Secondary | ICD-10-CM | POA: Insufficient documentation

## 2016-04-03 DIAGNOSIS — K219 Gastro-esophageal reflux disease without esophagitis: Secondary | ICD-10-CM | POA: Insufficient documentation

## 2016-04-03 DIAGNOSIS — E1165 Type 2 diabetes mellitus with hyperglycemia: Secondary | ICD-10-CM | POA: Insufficient documentation

## 2016-04-03 DIAGNOSIS — Z794 Long term (current) use of insulin: Secondary | ICD-10-CM | POA: Insufficient documentation

## 2016-04-03 DIAGNOSIS — E860 Dehydration: Secondary | ICD-10-CM | POA: Insufficient documentation

## 2016-04-03 LAB — COMPREHENSIVE METABOLIC PANEL
ALT: 10 U/L (ref 0–55)
AST (SGOT): 13 U/L (ref 5–34)
Albumin/Globulin Ratio: 0.7 — ABNORMAL LOW (ref 0.9–2.2)
Albumin: 2.9 g/dL — ABNORMAL LOW (ref 3.5–5.0)
Alkaline Phosphatase: 97 U/L (ref 37–106)
Anion Gap: 11 (ref 5.0–15.0)
BUN: 24 mg/dL — ABNORMAL HIGH (ref 7.0–19.0)
Bilirubin, Total: 0.5 mg/dL (ref 0.2–1.2)
CO2: 23 mEq/L (ref 22–29)
Calcium: 9.6 mg/dL (ref 8.5–10.5)
Chloride: 100 mEq/L (ref 100–111)
Creatinine: 1.6 mg/dL — ABNORMAL HIGH (ref 0.6–1.0)
Globulin: 3.9 g/dL — ABNORMAL HIGH (ref 2.0–3.6)
Glucose: 380 mg/dL — ABNORMAL HIGH (ref 70–100)
Potassium: 4.5 mEq/L (ref 3.5–5.1)
Protein, Total: 6.8 g/dL (ref 6.0–8.3)
Sodium: 134 mEq/L — ABNORMAL LOW (ref 136–145)

## 2016-04-03 LAB — URINALYSIS, REFLEX TO MICROSCOPIC EXAM IF INDICATED
Bilirubin, UA: NEGATIVE
Glucose, UA: >=1000 — AB
Leukocyte Esterase, UA: NEGATIVE
Nitrite, UA: NEGATIVE
Protein, UR: >=300 — AB
Specific Gravity UA: 1.025 (ref 1.001–1.035)
Urine pH: 6 (ref 5.0–8.0)
Urobilinogen, UA: 0.2 mg/dL (ref 0.2–2.0)

## 2016-04-03 LAB — CBC AND DIFFERENTIAL
Absolute NRBC: 0 10*3/uL
Basophils Absolute Automated: 0.02 10*3/uL (ref 0.00–0.20)
Basophils Automated: 0.1 %
Eosinophils Absolute Automated: 0.02 10*3/uL (ref 0.00–0.70)
Eosinophils Automated: 0.1 %
Hematocrit: 30.2 % — ABNORMAL LOW (ref 37.0–47.0)
Hgb: 9.9 g/dL — ABNORMAL LOW (ref 12.0–16.0)
Immature Granulocytes Absolute: 0.05 10*3/uL
Immature Granulocytes: 0.4 %
Lymphocytes Absolute Automated: 1.24 10*3/uL (ref 0.50–4.40)
Lymphocytes Automated: 8.9 %
MCH: 26.3 pg — ABNORMAL LOW (ref 28.0–32.0)
MCHC: 32.8 g/dL (ref 32.0–36.0)
MCV: 80.3 fL (ref 80.0–100.0)
MPV: 12.8 fL — ABNORMAL HIGH (ref 9.4–12.3)
Monocytes Absolute Automated: 0.54 10*3/uL (ref 0.00–1.20)
Monocytes: 3.9 %
Neutrophils Absolute: 12.11 10*3/uL — ABNORMAL HIGH (ref 1.80–8.10)
Neutrophils: 86.6 %
Nucleated RBC: 0 /100 WBC (ref 0.0–1.0)
Platelets: 234 10*3/uL (ref 140–400)
RBC: 3.76 10*6/uL — ABNORMAL LOW (ref 4.20–5.40)
RDW: 12 % (ref 12–15)
WBC: 13.98 10*3/uL — ABNORMAL HIGH (ref 3.50–10.80)

## 2016-04-03 LAB — HCG QUANTITATIVE: hCG, Quant.: <1.2

## 2016-04-03 LAB — GFR: EGFR: 41.9

## 2016-04-03 LAB — HEMOLYSIS INDEX: Hemolysis Index: 57 — ABNORMAL HIGH (ref 0–18)

## 2016-04-03 LAB — GLUCOSE WHOLE BLOOD - POCT
Whole Blood Glucose POCT: 348 mg/dL — ABNORMAL HIGH (ref 70–100)
Whole Blood Glucose POCT: 298 mg/dL — ABNORMAL HIGH (ref 70–100)

## 2016-04-03 LAB — LIPASE: Lipase: 7 U/L — ABNORMAL LOW (ref 8–78)

## 2016-04-03 MED ORDER — METRONIDAZOLE 500 MG PO TABS
500.0000 mg | ORAL_TABLET | Freq: Two times a day (BID) | ORAL | Status: AC
Start: 2016-04-03 — End: 2016-04-10

## 2016-04-03 MED ORDER — INSULIN REGULAR HUMAN 100 UNIT/ML IJ SOLN
5.0000 [IU] | Freq: Once | INTRAMUSCULAR | Status: AC
Start: 2016-04-03 — End: 2016-04-03
  Administered 2016-04-03: 5 [IU] via SUBCUTANEOUS
  Filled 2016-04-03: qty 15

## 2016-04-03 MED ORDER — IODIXANOL 320 MG/ML IV SOLN
100.0000 mL | Freq: Once | INTRAVENOUS | Status: AC | PRN
Start: 2016-04-03 — End: 2016-04-03
  Administered 2016-04-03: 100 mL via INTRAVENOUS

## 2016-04-03 MED ORDER — SODIUM CHLORIDE 0.9 % IV BOLUS
1000.0000 mL | Freq: Once | INTRAVENOUS | Status: AC
Start: 2016-04-03 — End: 2016-04-03
  Administered 2016-04-03: 1000 mL via INTRAVENOUS

## 2016-04-03 MED ORDER — ONDANSETRON 4 MG PO TBDP
4.0000 mg | ORAL_TABLET | Freq: Four times a day (QID) | ORAL | Status: DC | PRN
Start: 2016-04-03 — End: 2019-05-11

## 2016-04-03 MED ORDER — ONDANSETRON HCL 4 MG/2ML IJ SOLN
4.0000 mg | Freq: Once | INTRAMUSCULAR | Status: AC
Start: 2016-04-03 — End: 2016-04-03
  Administered 2016-04-03: 4 mg via INTRAVENOUS
  Filled 2016-04-03: qty 2

## 2016-04-03 MED ORDER — CIPROFLOXACIN HCL 500 MG PO TABS
500.0000 mg | ORAL_TABLET | Freq: Two times a day (BID) | ORAL | Status: AC
Start: 2016-04-03 — End: 2016-04-10

## 2016-04-03 MED ORDER — METRONIDAZOLE 250 MG PO TABS
500.0000 mg | ORAL_TABLET | Freq: Once | ORAL | Status: AC
Start: 2016-04-03 — End: 2016-04-03
  Administered 2016-04-03: 500 mg via ORAL
  Filled 2016-04-03: qty 2

## 2016-04-03 MED ORDER — HYDROCODONE-ACETAMINOPHEN 5-325 MG PO TABS
1.0000 | ORAL_TABLET | Freq: Four times a day (QID) | ORAL | Status: DC | PRN
Start: 2016-04-03 — End: 2017-11-28

## 2016-04-03 MED ORDER — CIPROFLOXACIN HCL 500 MG PO TABS
500.0000 mg | ORAL_TABLET | Freq: Once | ORAL | Status: AC
Start: 2016-04-03 — End: 2016-04-03
  Administered 2016-04-03: 500 mg via ORAL
  Filled 2016-04-03: qty 1

## 2016-04-03 MED ORDER — MORPHINE SULFATE 4 MG/ML IJ/IV SOLN (WRAP)
4.0000 mg | Freq: Once | Status: AC
Start: 2016-04-03 — End: 2016-04-03
  Administered 2016-04-03: 4 mg via INTRAVENOUS
  Filled 2016-04-03: qty 1

## 2016-04-03 NOTE — ED Provider Notes (Signed)
EMERGENCY DEPARTMENT HISTORY AND PHYSICAL EXAM     Physician/Midlevel provider first contact with patient: 04/03/16 1612         Date: 04/03/2016  Patient Name: Elizabeth Dunlap    History of Presenting Illness     Chief Complaint   Patient presents with   . Hyperglycemia   . Abdominal Pain     History Provided By: Patient    Chief Complaint: Abd pain  Onset: 1 day ago  Timing: Intermittent  Location: L side of abdomin  Severity: "Severe"  Modifying Factors: None tried  Associated Symptoms: N/V, constipation  Pertinent Negatives: Na    Additional History: Elizabeth Dunlap is a 45 y.o. female multiple medical problems including DM presenting to the ED with c/o "severe" intermittent L sided abd pain since yesterday after lunch, but worse today. Pt went to work and persistent pain, nauseated and multiple episodes of emesis. Pt reports drinking plenty of water today, but still feeling bad. Pt was told by her pmd to come to the ED due to her PMHx of Diverticulitis. Pt states she has been having issues with managing her medication for her PMHx and being under a lot of stress. Pt reports constipation, but denies all other symptoms.     PCP: Cheryle Horsfall, MD  SPECIALISTS:    Current Facility-Administered Medications   Medication Dose Route Frequency Provider Last Rate Last Dose   . ciprofloxacin (CIPRO) tablet 500 mg  500 mg Oral Once Jimmye Norman, MD       . insulin regular (HumuLIN R,NovoLIN R) injection 5 Units  5 Units Subcutaneous Once Jimmye Norman, MD       . metroNIDAZOLE (FLAGYL) tablet 500 mg  500 mg Oral Once Jimmye Norman, MD         Current Outpatient Prescriptions   Medication Sig Dispense Refill   . Cholecalciferol (VITAMIN D PO) Take 5,000 Units by mouth every other day.        . Insulin Glargine (LANTUS SC) Inject 40 Units into the skin 2 (two) times daily.        . insulin lispro (HUMALOG) 100 UNIT/ML injection Inject 16 Units into the skin 3 (three) times daily before meals.     . IRON PO  Take 65 mg by mouth daily.        Marland Kitchen LACTOBACILLUS PO Take 1 capsule by mouth daily.     . valsartan-hydrochlorothiazide (DIOVAN-HCT) 80-12.5 MG per tablet Take 1 tablet by mouth daily.     . ciprofloxacin (CIPRO) 500 MG tablet Take 1 tablet (500 mg total) by mouth 2 (two) times daily. 14 tablet 0   . HYDROcodone-acetaminophen (NORCO) 5-325 MG per tablet Take 1 tablet by mouth every 6 (six) hours as needed (severe pain). 10 tablet 0   . metroNIDAZOLE (FLAGYL) 500 MG tablet Take 1 tablet (500 mg total) by mouth 2 (two) times daily. 14 tablet 0   . ondansetron (ZOFRAN-ODT) 4 MG disintegrating tablet Take 1 tablet (4 mg total) by mouth every 6 (six) hours as needed for Nausea. 8 tablet 0       Past History     Past Medical History:  Past Medical History   Diagnosis Date   . Asthma without status asthmaticus      childhood   . Diabetes mellitus type II, controlled    . GERD (gastroesophageal reflux disease)      occ.   . Anemia    . Vision abnormalities    .  DKA (diabetic ketoacidoses)        Past Surgical History:  Past Surgical History   Procedure Laterality Date   . Cesarean section     . Hysteroscopy, endometrial ablation, thermachoice  10/08/2012     Procedure: HYSTEROSCOPY, ENDOMETRIAL ABLATION, THERMACHOICE;  Surgeon: Larae Grooms, MD;  Location: [facility];  Service: Gynecology;  Laterality: N/A;  thermachoice       Family History:  History reviewed. No pertinent family history.    Social History:  Social History   Substance Use Topics   . Smoking status: Never Smoker    . Smokeless tobacco: Never Used   . Alcohol Use: No       Allergies:  Allergies   Allergen Reactions   . Penicillins Other (See Comments)     UNKNOWN       Review of Systems     Review of Systems   Constitutional: Negative for fever.   Gastrointestinal: Positive for nausea, vomiting, abdominal pain and constipation.   All other systems reviewed and are negative.        Physical Exam   BP 142/81 mmHg  Pulse 90  Temp(Src) 98.6 F (37 C)  (Oral)  Resp 18  Ht 5\' 4"  (1.626 m)  Wt 100.699 kg  BMI 38.09 kg/m2  SpO2 98%    Physical Exam   Constitutional: Patient is oriented to person, place, and time and well-developed, well-nourished, and in moderate distress.   Head: Normocephalic and atraumatic.   ENT/Eyes: EOM are normal. Pupils are equal, round, and reactive to light. MMM.  Neck: Normal range of motion. Neck supple.   Cardiovascular: Normal rate and regular rhythm.   Pulmonary/Chest: Effort normal and breath sounds normal. No respiratory distress.   Abdominal: Soft. There is no R side or epigastric tenderness, LLQ tenderness. No rebound or guarding.  Musculoskeletal: Normal range of motion. No lower extremity edema.  Neurological: Patient is alert and oriented to person, place, and time. No gross weakness.   Skin: Skin is warm and dry.         Diagnostic Study Results     Labs -     Results     Procedure Component Value Units Date/Time    UA, Reflex to Microscopic (pts  3 + yrs) [id number]  (Abnormal) Collected:  04/03/16 1618    Specimen Information:  Urine Updated:  04/03/16 1710     Urine Type Clean Catch      Color, UA Yellow      Clarity, UA Clear      Specific Gravity UA 1.025      Urine pH 6.0      Leukocyte Esterase, UA Negative      Nitrite, UA Negative      Protein, UR >=300 (A)      Glucose, UA >=1000 (A)      Ketones UA Trace (A)      Urobilinogen, UA 0.2 mg/dL      Bilirubin, UA Negative      Blood, UA Moderate (A)      RBC, UA 11 - 25 (A) /hpf      WBC, UA 0 - 5 /hpf      Squamous Epithelial Cells, Urine 0 - 5 /hpf     Beta HCG, Quant, Serum [id number] Collected:  04/03/16 1618     hCG, Quant. <1.2 Updated:  04/03/16 1659    GFR [id number] Collected:  04/03/16 1618     EGFR 41.9 Updated:  04/03/16  1649    Comprehensive metabolic panel [id number]  (Abnormal) Collected:  04/03/16 1618    Specimen Information:  Blood Updated:  04/03/16 1649     Glucose 380 (H) mg/dL      BUN 24.0 (H) mg/dL      Creatinine 1.6 (H) mg/dL      Sodium  134 (L) mEq/L      Potassium 4.5 mEq/L      Chloride 100 mEq/L      CO2 23 mEq/L      Calcium 9.6 mg/dL      Protein, Total 6.8 g/dL      Albumin 2.9 (L) g/dL      AST (SGOT) 13 U/L      ALT 10 U/L      Alkaline Phosphatase 97 U/L      Bilirubin, Total 0.5 mg/dL      Globulin 3.9 (H) g/dL      Albumin/Globulin Ratio 0.7 (L)      Anion Gap 11.0     Lipase [id number]  (Abnormal) Collected:  04/03/16 1618    Specimen Information:  Blood Updated:  04/03/16 1649     Lipase 7 (L) U/L     Hemolysis index [id number]  (Abnormal) Collected:  04/03/16 1618     Hemolysis Index 57 (H) Updated:  04/03/16 1649    CBC with differential [id number]  (Abnormal) Collected:  04/03/16 1618    Specimen Information:  Blood from Blood Updated:  04/03/16 1632     WBC 13.98 (H) x10 3/uL      Hgb 9.9 (L) g/dL      Hematocrit 30.2 (L) %      Platelets 234 x10 3/uL      RBC 3.76 (L) x10 6/uL      MCV 80.3 fL      MCH 26.3 (L) pg      MCHC 32.8 g/dL      RDW 12 %      MPV 12.8 (H) fL      Neutrophils 86.6 %      Lymphocytes Automated 8.9 %      Monocytes 3.9 %      Eosinophils Automated 0.1 %      Basophils Automated 0.1 %      Immature Granulocyte 0.4 %      Nucleated RBC 0.0 /100 WBC      Neutrophils Absolute 12.11 (H) x10 3/uL      Abs Lymph Automated 1.24 x10 3/uL      Abs Mono Automated 0.54 x10 3/uL      Abs Eos Automated 0.02 x10 3/uL      Absolute Baso Automated 0.02 x10 3/uL      Absolute Immature Granulocyte 0.05 x10 3/uL      Absolute NRBC 0.00 x10 3/uL     Urine Culture [id number] Collected:  04/03/16 1618    Specimen Information:  Urine from Urine, Clean Catch Updated:  04/03/16 1618    Glucose Whole Blood - POCT [id number]  (Abnormal) Collected:  04/03/16 1517     POCT - Glucose Whole blood 348 (H) mg/dL Updated:  04/03/16 1519          Radiologic Studies -   Radiology Results (24 Hour)     Procedure Component Value Units Date/Time    CT Abdomen Pelvis W IV/ WO PO Cont [id number] Collected:  04/03/16 1823    Order Status:   Completed Updated:  04/03/16 1834    Narrative:  CT ABDOMEN & PELVIS W CONT     CLINICAL HISTORY: LLQ PAIN, SUSPECT DIVERTICULITIS.    COMPARISON: None. .    TECHNIQUE: Following intravenous contrast, helical CT is performed from  the lung bases to the pelvic outlet in the portal venous phase and  reconstructed in sagittal and coronal planes.  110ml Visipaque 320 is  used.    Note: Note that CT scanning at this site  utilizes multiple dose  reduction techniques including automatic exposure control, adjustment of  the MAS and/or KVP according to patient's size and use of iterative  reconstruction technique    FINDINGS: Subcentimeter fat-containing umbilical hernia. No acute or  destructive skeletal findings. Calcified granuloma of the right lower  lung base. Minimal dependent atelectasis. Minimal atherosclerosis of the  aorta. No aortic aneurysm.    Liver, spleen, gallbladder and pancreas are unremarkable. Adrenal glands  are normal. There is minimal perinephric inflammatory stranding of the  kidneys. Symmetric renal enhancement. No hydronephrosis or stones.    Bladder is unremarkable. Uterus and adnexa unremarkable. Small right  ovarian dominant follicle noted. Minimal amount of free fluid in  posterior cul-de-sac may be physiologic.    The left hemicolon is decompressed. Minimal amount of fluid in the right  hemicolon. Small bowel loops are decompressed. No abnormal  lymphadenopathy.      Impression:       Small amount of fluid in otherwise decompressed colon could  be related to a colitis. However, there are no areas of wall thickening  or inflammation to support this diagnosis by imaging.    Jeanella Cara, MD   04/03/2016 6:30 PM        .    Medical Decision Making   I am the first provider for this patient.    I reviewed the vital signs, available nursing notes, past medical history, past surgical history, family history and social history.    Vital Signs-Reviewed the patient's vital signs.     Patient  Vitals for the past 12 hrs:   BP Temp Pulse Resp   04/03/16 1805 142/81 mmHg - 90 18   04/03/16 1631 141/74 mmHg 98.6 F (37 C) 96 16   04/03/16 1505 (!) 176/97 mmHg 97.2 F (36.2 C) 98 16     Pulse Oximetry Analysis - Normal 98% on RA    Old Medical Records: Old medical records.     ED Course:   4:20 PM - Discussed ED plan for labs and CT.     6:51 PM - Updated pt on results and plan for discharge and f/u with pmd in a couple of days. Pt feels comfortable going home.     7:02 PM - Discussed results with pt and counseled on diagnosis, f/u plans, medication use, and return precautions to ED.  Pt is stable and ready for discharge.       Provider Notes: 54F presented with LLQ abd pain, vomiting, diarrhea, hyperglycemia. Labs c/w dehydration. Worsening renal fxn. IVF. CT showed colitis. Abx. Tol POs. Pt much prefers [facility] to admission. Discussed risks. Understands. Improved on re-evaluation. Close f/u. Discussed importance of f/u and indications for return. Rx'd cipro, flagyl, zofran, norco.     Diagnosis     Clinical Impression:   1. Colitis    2. Dehydration    3. Hyperglycemia        Treatment Plan:   ED Disposition     Discharge [facility] discharge to home/self care.    Condition at  disposition: Stable               _______________________________      Attestations: This note is prepared by Vincent Gros, acting as scribe for Hilliard Clark, MD.    Hilliard Clark, MD - The scribe's documentation has been prepared under my direction and personally reviewed by me in its entirety.  I confirm that the note above accurately reflects all work, treatment, procedures, and medical decision making performed by me.    _______________________________    Jimmye Norman, MD  04/03/16 2348

## 2016-04-03 NOTE — ED Notes (Signed)
LLQ abd pain since yesterday, vomiting, diarrhea, seen by DR. Chevis Pretty in office today.  A1C 9.9, BS  378 today, sent to ED to R/O DKA and diverticulitis.

## 2016-04-03 NOTE — ED Notes (Signed)
GLU [id number]

## 2016-04-03 NOTE — Discharge Instructions (Signed)
Dear Elizabeth Dunlap:     Thank you for choosing the [facility] for your healthcare needs. It was a privilege caring for you. We are genuinely concerned about your health and comfort.     Below is some information our patients often find helpful.    Instructions:     We recommend taking Tylenol as needed for discomfort.      It is critical that you return immediately or follow up with your doctor if your symptoms worsen, including:  You have worsening pain.  You are unable to keep food down.  You have fevers greater than 101.4 degrees.  You have any concerns.    We recommend checking GoodRx.com to find coupons to lower the cost of your prescriptions.    You will likely be receiving a survey about the care you received in the emergency department. It would mean a lot to our care team if you could fill that out and return the form. We're always looking to improve and your feedback will help [place] do that.    We wish you good health. Please do not hesitate to contact me if I can be of assistance. We aim to provide excellent care.     Sincerely,   Lolly Mustache. Earley Favor, MD FACEP  [email or web address]  [facility]  [phone]        Dehydration    You have been diagnosed with dehydration.    Dehydration is when your body is low on fluids (liquids). Dehydration has a variety of causes. These range from vomiting and diarrhea, to excessive (a lot of) sweating and poor appetite.    You have received intravenous (IV) fluids. These are to help fix your dehydration. It is important to keep hydrating yourself at home. Drink plenty of fluids frequently. Drink fluids that won t upset your stomach. This includes water and juice. It also includes drinks like Gatorade. Stay away from beverages like soda pop and tea. They may make you worse. Avoid caffeine and alcohol. They may cause you to lose more fluid.    YOU SHOULD SEEK MEDICAL ATTENTION IMMEDIATELY, EITHER HERE OR AT THE  NEAREST EMERGENCY DEPARTMENT, IF ANY OF THE FOLLOWING OCCURS:   Fever (temperature higher than 100.55F / 38C) or shaking chills.   Constant vomiting and/or diarrhea.   Lightheadedness or fainting.   If you do not urinate (pee) for 8 or more hours.              Hyperglycemia    During the visit today, your blood sugar was found to be high.    The medical term for high blood sugar is hyperglycemia. This may be a one-time event, but it could mean you have diabetes. Untreated diabetes can lead to heart problems and kidney problems (including kidney failure). It can also lead to stroke and blindness. It is very important to follow up with your regular doctor to have your blood sugar re-checked.    Tell your regular doctor that your blood sugar was high today. Your doctor will want to recheck your blood. He or she may also want to order other lab tests. If the doctor finds that you have diabetes, you will need medicine and a special diet to control your blood sugar.    YOU SHOULD SEEK MEDICAL ATTENTION IMMEDIATELY, EITHER HERE OR AT THE NEAREST EMERGENCY DEPARTMENT, IF ANY OF THE FOLLOWING OCCURS:   Confusion or lethargy (very sleepy and hard to wake up).  Signs of dehydration. Signs include less urination (peeing), dry mouth, extreme fatigue (tiredness), lightheadedness or fainting.   Constant vomiting (throwing up).   Fever (temperature higher than 100.62F / 38C) or shaking chills.   Abdominal (belly) pain or vomiting (throwing up).              Colitis, Nonspecific    You have been diagnosed with colitis.     "Colitis" means inflammation of the colon. The colon is also called the large intestine.     There are many causes of colitis. These include infection and autoimmune reactions. It is not clear what is causing your illness right now.     There are different symptoms of colitis. Most people have abdominal (belly) pain and diarrhea (loose stool). Some people have blood in the stools. Sometimes  people always feel like they need to have a bowel movement (stool.) Sometimes there may be a fever (temperature higher than 100.62F / 38C).     Tests are needed to find the cause of colitis. These could be a blood test or CT scan. Sometimes a special test called a "colonoscopy" is needed. This is when a small camera is put into the bowels to examine them. Follow up with all testing recommended today.     People with diarrhea often lose a lot of body fluids. This causes dehydration. Drink a lot of water or other fluids to stay hydrated. You may also be given medicine for your diarrhea. It will slow the diarrhea and stop the vomiting (if you have this symptom).    You should drink lots of natural juices or a sports-type drink that has electrolytes (sodium, potassium, etc). Do not drink a lot of plain water. Sugary drinks like apple and pear juice might make the diarrhea worse.    You might be given medicine to help you with your diarrhea, nausea (feeling sick), vomiting and stomach cramps. This will depend on your age, medical history and symptoms.    It is OK for you to go home today.    It is very important that you follow up with your regular doctor or a specialist. The doctor will tell you how soon this follow-up needs to be.    YOU SHOULD SEEK MEDICAL ATTENTION IMMEDIATELY, EITHER HERE OR AT THE NEAREST EMERGENCY DEPARTMENT, IF ANY OF THE FOLLOWING OCCURS:   You are vomiting and cannot keep down fluids.   You have symptoms of dehydration. These include dry mouth, not urinating at least once every eight hours, feeling dizzy/lightheaded, severe weakness or passing out.   Your belly pain gets worse.   You have fever (temperature higher than 100.62F / 38C).

## 2016-04-04 ENCOUNTER — Telehealth: Payer: Self-pay | Admitting: Emergency Medicine

## 2016-04-04 NOTE — Telephone Encounter (Signed)
Feeling better. Zofran helping. Discussed indications for return.

## 2016-10-12 DIAGNOSIS — E559 Vitamin D deficiency, unspecified: Secondary | ICD-10-CM | POA: Insufficient documentation

## 2018-11-01 ENCOUNTER — Emergency Department
Admission: EM | Admit: 2018-11-01 | Discharge: 2018-11-02 | Disposition: A | Discharge status: Home / Self Care | Payer: BC Managed Care – PPO | Attending: Emergency Medicine | Admitting: Emergency Medicine

## 2018-11-01 ENCOUNTER — Emergency Department: Payer: BC Managed Care – PPO

## 2018-11-01 DIAGNOSIS — Z794 Long term (current) use of insulin: Secondary | ICD-10-CM | POA: Insufficient documentation

## 2018-11-01 DIAGNOSIS — Z7902 Long term (current) use of antithrombotics/antiplatelets: Secondary | ICD-10-CM | POA: Insufficient documentation

## 2018-11-01 DIAGNOSIS — N189 Chronic kidney disease, unspecified: Secondary | ICD-10-CM | POA: Insufficient documentation

## 2018-11-01 DIAGNOSIS — Z7982 Long term (current) use of aspirin: Secondary | ICD-10-CM | POA: Insufficient documentation

## 2018-11-01 DIAGNOSIS — D631 Anemia in chronic kidney disease: Secondary | ICD-10-CM | POA: Insufficient documentation

## 2018-11-01 DIAGNOSIS — E1122 Type 2 diabetes mellitus with diabetic chronic kidney disease: Secondary | ICD-10-CM | POA: Insufficient documentation

## 2018-11-01 DIAGNOSIS — M7989 Other specified soft tissue disorders: Secondary | ICD-10-CM | POA: Insufficient documentation

## 2018-11-01 HISTORY — DX: Chronic kidney disease, unspecified: N18.9

## 2018-11-01 LAB — COMPREHENSIVE METABOLIC PANEL
ALT: 13 U/L (ref 0–55)
AST (SGOT): 19 U/L (ref 5–34)
Albumin/Globulin Ratio: 1 (ref 0.9–2.2)
Albumin: 3.4 g/dL — ABNORMAL LOW (ref 3.5–5.0)
Alkaline Phosphatase: 47 U/L (ref 37–106)
Anion Gap: 11 (ref 5.0–15.0)
BUN: 24 mg/dL — ABNORMAL HIGH (ref 7.0–19.0)
Bilirubin, Total: 0.5 mg/dL (ref 0.2–1.2)
CO2: 20 mEq/L — ABNORMAL LOW (ref 22–29)
Calcium: 8.7 mg/dL (ref 8.5–10.5)
Chloride: 105 mEq/L (ref 100–111)
Creatinine: 2.4 mg/dL — ABNORMAL HIGH (ref 0.6–1.0)
Globulin: 3.5 g/dL (ref 2.0–3.6)
Glucose: 136 mg/dL — ABNORMAL HIGH (ref 70–100)
Potassium: 4.3 mEq/L (ref 3.5–5.1)
Protein, Total: 6.9 g/dL (ref 6.0–8.3)
Sodium: 136 mEq/L (ref 136–145)

## 2018-11-01 LAB — CBC AND DIFFERENTIAL
Absolute NRBC: 0 10*3/uL (ref 0.00–0.00)
Basophils Absolute Automated: 0.01 10*3/uL (ref 0.00–0.08)
Basophils Automated: 0.1 %
Eosinophils Absolute Automated: 0.06 10*3/uL (ref 0.00–0.44)
Eosinophils Automated: 0.9 %
Hematocrit: 24.9 % — ABNORMAL LOW (ref 34.7–43.7)
Hgb: 8 g/dL — ABNORMAL LOW (ref 11.4–14.8)
Immature Granulocytes Absolute: 0.02 10*3/uL (ref 0.00–0.07)
Immature Granulocytes: 0.3 %
Lymphocytes Absolute Automated: 2.16 10*3/uL (ref 0.42–3.22)
Lymphocytes Automated: 31.7 %
MCH: 27.3 pg (ref 25.1–33.5)
MCHC: 32.1 g/dL (ref 31.5–35.8)
MCV: 85 fL (ref 78.0–96.0)
MPV: 11 fL (ref 8.9–12.5)
Monocytes Absolute Automated: 0.43 10*3/uL (ref 0.21–0.85)
Monocytes: 6.3 %
Neutrophils Absolute: 4.13 10*3/uL (ref 1.10–6.33)
Neutrophils: 60.7 %
Nucleated RBC: 0 /100 WBC (ref 0.0–0.0)
Platelets: 183 10*3/uL (ref 142–346)
RBC: 2.93 10*6/uL — ABNORMAL LOW (ref 3.90–5.10)
RDW: 13 % (ref 11–15)
WBC: 6.81 10*3/uL (ref 3.10–9.50)

## 2018-11-01 LAB — TROPONIN I: Troponin I: <0.01 ng/mL (ref 0.00–0.05)

## 2018-11-01 LAB — B-TYPE NATRIURETIC PEPTIDE: B-Natriuretic Peptide: 30 pg/mL (ref 0–100)

## 2018-11-01 LAB — HEMOLYSIS INDEX: Hemolysis Index: 70 — ABNORMAL HIGH (ref 0–18)

## 2018-11-01 LAB — GFR: EGFR: 25.9

## 2018-11-01 NOTE — ED Triage Notes (Signed)
For the past several weeks, patient has had swollen ankles and feet.  PT states she has been having trouble sleeping.  She also c/o left flank pain.  Pt reports frequent urination.  PT states she has h/o CKD.

## 2018-11-01 NOTE — ED Provider Notes (Signed)
EMERGENCY DEPARTMENT HISTORY AND PHYSICAL EXAM     Physician/Midlevel provider first contact with patient: 11/01/18 2101         Date: 11/01/2018  Patient Name: Elizabeth Dunlap  Attending Physician: Donia Guiles, MD  Advanced Practice Provider: Pandora Leiter    History of Presenting Illness       History Provided By: Patient    Chief Complaint:  Chief Complaint   Patient presents with   . Leg Swelling     Onset: 2-3 weeks  Timing: Waxing and Waning  Location: bilateral ankles and feet  Quality: swelling  Severity: 4/10 in severity  Exacerbating factors: none  Alleviating factors: none  Associated Symptoms: urinary frequency, cough when laying supine, left low back pain  Pertinent Negatives: Denies fever, chills, myalgias, CP, n/v, abd pain, tingling, numbness.    Additional History: Elizabeth Dunlap is a 48 y.o. female with h/o anemia, CKD, CAD, Type II DM, GERD presenting to the ED with bilateral ankle and foot swelling x 2-3 weeks. Associated symptoms include urinary frequency and cough when laying dupine. She states that she saw her PCP a few months ago and had a creatinine of 2.3. She also has paperwork from December 2019 showing a Creatinine of 2.2. She sees a nephrologist and a cardiologist regularly (every 3 months). Denies exogenous estrogens, recent airplane travel, recent hospitalizations/surgeries, previous DVT/PE, malignancy, hemoptysis, calf pain.    PCP: Cheryle Horsfall, MD  SPECIALISTS:    No current facility-administered medications for this encounter.      Current Outpatient Medications   Medication Sig Dispense Refill   . aspirin EC 81 MG EC tablet Take 81 mg by mouth     . candesartan (ATACAND) 8 MG tablet Take 8 mg by mouth daily     . carvedilol (COREG) 25 MG tablet Take 25 mg by mouth     . clopidogrel (PLAVIX) 75 mg tablet Take 75 mg by mouth daily     . hydroCHLOROthiazide (HYDRODIURIL) 25 MG tablet Take 25 mg by mouth daily     . insulin aspart (NOVOLOG) 100 UNIT/ML injection Inject  into the skin 3 (three) times daily before meals     . insulin glargine 100 UNIT/ML injection pen Inject into the skin nightly     . rosuvastatin (CRESTOR) 40 MG tablet Take 40 mg by mouth daily     . Cholecalciferol (VITAMIN D PO) Take 5,000 Units by mouth every other day.        . Insulin Glargine (LANTUS SC) Inject 40 Units into the skin 2 (two) times daily.        . insulin lispro (HUMALOG) 100 UNIT/ML injection Inject 16 Units into the skin 3 (three) times daily before meals.     . IRON PO Take 65 mg by mouth daily.        Marland Kitchen LACTOBACILLUS PO Take 1 capsule by mouth daily.     . ondansetron (ZOFRAN-ODT) 4 MG disintegrating tablet Take 1 tablet (4 mg total) by mouth every 6 (six) hours as needed for Nausea. 8 tablet 0   . valsartan-hydrochlorothiazide (DIOVAN-HCT) 80-12.5 MG per tablet Take 1 tablet by mouth daily.         Past History     Past Medical History:  Past Medical History:   Diagnosis Date   . Anemia    . Asthma without status asthmaticus     childhood   . Chronic kidney disease    . Diabetes mellitus  type II, controlled    . DKA (diabetic ketoacidoses)    . GERD (gastroesophageal reflux disease)     occ.   . Vision abnormalities        Past Surgical History:  Past Surgical History:   Procedure Laterality Date   . CESAREAN SECTION     . CORONARY ANGIOPLASTY WITH STENT PLACEMENT     . HYSTEROSCOPY, ENDOMETRIAL ABLATION, THERMACHOICE  10/08/2012    Procedure: HYSTEROSCOPY, ENDOMETRIAL ABLATION, THERMACHOICE;  Surgeon: Larae Grooms, MD;  Location: [facility];  Service: Gynecology;  Laterality: N/A;  thermachoice       Family History:  History reviewed. No pertinent family history.    Social History:  Social History     Tobacco Use   . Smoking status: Never Smoker   . Smokeless tobacco: Never Used   Substance Use Topics   . Alcohol use: No   . Drug use: No       Allergies:  Allergies   Allergen Reactions   . Penicillins Other (See Comments)     UNKNOWN   . Ace Inhibitors      cough       Review of  Systems     Review of Systems   Constitutional: Negative for chills, diaphoresis, fatigue and fever.   HENT: Negative for congestion, rhinorrhea, sneezing, sore throat and trouble swallowing.    Eyes: Negative for photophobia, discharge and redness.   Respiratory: Positive for cough. Negative for chest tightness, shortness of breath and wheezing.    Cardiovascular: Positive for leg swelling. Negative for chest pain and palpitations.   Gastrointestinal: Negative for abdominal pain, diarrhea, nausea and vomiting.   Genitourinary: Negative for dysuria, frequency and urgency.   Musculoskeletal: Positive for back pain (left low back). Negative for arthralgias, gait problem, joint swelling, myalgias, neck pain and neck stiffness.   Skin: Negative for color change, pallor, rash and wound.   Neurological: Negative for dizziness, tremors, weakness, light-headedness, numbness and headaches.       Physical Exam     Vitals:    11/01/18 2041 11/02/18 0044   BP: (!) 176/101 (!) 180/92   Pulse: 91 90   Resp: 20 16   Temp: 98.3 F (36.8 C) 98.2 F (36.8 C)   TempSrc: Oral Oral   SpO2: 100% 100%   Weight: 104.8 kg    Height: 5\' 5"  (1.651 m)        Gen: A&Ox3, WD/WN, lying on gurney in no acute distress  Head: Normocephalic, atraumatic. Facial movements symmetric  Skin: anicteric, no cyanosis, no pruritus, warm to touch, normal turgor  Eyes: PERRLA, sclera anicteric, no conjunctival injection  Oropharynx: clear, mucous membranes moist, uvula midline, no lesions  Neck: Supple, no lymphadenopathy, FAROM, midline NT  Lungs: CTAB, no w/r/r, symmetric chest expansion  CV: S1/S2 crisp, RRR, no m/r/g  Abd: ND, NABSx4, soft, NT. no guarding, rigidity, or rebound tenderness. no masses or hepatosplenomegaly  Back: no gross deformity, mid-line NT, no CVAT, mild TTP left lumbar paraspinal muscle  Neuro: CN II-XII grossly intact. Sensation intact throughout to light touch  Ext: no significant edema, peripheral pulses 2+, capillary refill < 2  sec, FAROM all 4 extremities. No calf tenderness, negative Homan's sign bilat    Diagnostic Study Results     Labs -     Results     Procedure Component Value Units Date/Time    UA, Reflex to Microscopic [[id number]]  (Abnormal) Collected:  11/02/18 0026  Specimen:  Urine Updated:  11/02/18 0110     Urine Type Clean Catch     Color, UA Colorless     Clarity, UA Clear     Specific Gravity UA 1.005     Urine pH 7.0     Leukocyte Esterase, UA Negative     Nitrite, UA Negative     Protein, UR 100     Glucose, UA Negative     Ketones UA Negative     Urobilinogen, UA Negative mg/dL      Bilirubin, UA Negative     Blood, UA Negative     RBC, UA 0 - 2 /hpf      WBC, UA 0 - 5 /hpf     B-type Natriuretic Peptide [[id number]] Collected:  11/01/18 2206    Specimen:  Blood Updated:  11/01/18 2251     B-Natriuretic Peptide 30 pg/mL     Troponin I [[id number]] Collected:  11/01/18 2206    Specimen:  Blood Updated:  11/01/18 2247     Troponin I <0.01 ng/mL     Comprehensive metabolic panel [[id number]]  (Abnormal) Collected:  11/01/18 2206    Specimen:  Blood Updated:  11/01/18 2239     Glucose 136 mg/dL      BUN 24.0 mg/dL      Creatinine 2.4 mg/dL      Sodium 136 mEq/L      Potassium 4.3 mEq/L      Chloride 105 mEq/L      CO2 20 mEq/L      Calcium 8.7 mg/dL      Protein, Total 6.9 g/dL      Albumin 3.4 g/dL      AST (SGOT) 19 U/L      ALT 13 U/L      Alkaline Phosphatase 47 U/L      Bilirubin, Total 0.5 mg/dL      Globulin 3.5 g/dL      Albumin/Globulin Ratio 1.0     Anion Gap 11.0    Hemolysis index [[id number]]  (Abnormal) Collected:  11/01/18 2206     Updated:  11/01/18 2239     Hemolysis Index 70    GFR [[id number]] Collected:  11/01/18 2206     Updated:  11/01/18 2239     EGFR 25.9    CBC and differential [[id number]]  (Abnormal) Collected:  11/01/18 2206    Specimen:  Blood Updated:  11/01/18 2222     WBC 6.81 x10 3/uL      Hgb 8.0 g/dL      Hematocrit 24.9 %      Platelets 183 x10 3/uL      RBC 2.93 x10 6/uL      MCV 85.0 fL       MCH 27.3 pg      MCHC 32.1 g/dL      RDW 13 %      MPV 11.0 fL      Neutrophils 60.7 %      Lymphocytes Automated 31.7 %      Monocytes 6.3 %      Eosinophils Automated 0.9 %      Basophils Automated 0.1 %      Immature Granulocyte 0.3 %      Nucleated RBC 0.0 /100 WBC      Neutrophils Absolute 4.13 x10 3/uL      Abs Lymph Automated 2.16 x10 3/uL      Abs Mono Automated 0.43 x10 3/uL  Abs Eos Automated 0.06 x10 3/uL      Absolute Baso Automated 0.01 x10 3/uL      Absolute Immature Granulocyte 0.02 x10 3/uL      Absolute NRBC 0.00 x10 3/uL           Radiologic Studies -   Radiology Results (24 Hour)     Procedure Component Value Units Date/Time    Chest AP Portable [[id number]] Collected:  11/01/18 2214    Order Status:  Completed Updated:  11/01/18 2219    Narrative:       HISTORY::Shortness of breath    TECHNIQUE: An AP portable chest performed at 2205 hours    FINDINGS: The lungs are clear of acute focal infiltrates.  The heart,  mediastinum and bony thorax are unremarkable for age and AP technique.      Impression:        No radiographic evidence for acute cardiopulmonary disease.    Justine Null, MD   11/01/2018 10:15 PM      .    Medical Decision Making   I am the first provider for this patient.    I reviewed the vital signs, available nursing notes, past medical history, past surgical history, family history and social history.    Vital Signs-Reviewed the patient's vital signs.     Patient Vitals for the past 12 hrs:   BP Temp Pulse Resp   11/02/18 0044 (!) 180/92 98.2 F (36.8 C) 90 16   11/01/18 2041 (!) 176/101 98.3 F (36.8 C) 91 20       Pulse Oximetry Analysis - Normal 100% on RA    Old Medical Records: Old medical records.  Nursing notes.  Previous radiology studies.     ED Course:     Provider Notes:   Elizabeth Dunlap is a 48 y.o. female presenting to the ED with intermittent bilateral foot/ankle swelling x 2-3 weeks. Afebrile, VSS, neurovasc intact. No appreciable swelling on exam today,  though patient states that it typically occurs along her medial ankle. Creatinine 2.4 today (previously 2.3 a few months ago), BUN 24. +anemia, chronic, similar to previous labs (has paperwork). CXR negative. No evidence of acute CHF. Creatinine near baseline. Do not suspect DVT at this time (symptoms are bilateral, localized to medial ankles, and intermittent without associated pain. No DVT risk factors). Advised discuss medications with nephrologist and cardiologist and further monitor BP at home. Patient counseled on diagnosis, treatment plan, f/u plans, and signs and symptoms when to return to ED. Pt is stable and ready for discharge.    D/w Dr. Burns Spain    Diagnosis     Clinical Impression:   1. Leg swelling    2. Chronic kidney disease, unspecified CKD stage    3. Anemia due to chronic kidney disease, unspecified CKD stage        Treatment Plan:   ED Disposition     ED Disposition Condition Date/Time Comment    Discharge  Sun Nov 02, 2018  1:16 AM Elizabeth Dunlap discharge to home/self care.    Condition at disposition: Stable            _______________________________    CHART OWNERSHIPComer Locket, PA-C, am the primary clinician of record.  _______________________________       Pandora Leiter, PA  11/03/18 1600       Donia Guiles, MD  11/03/18 2119

## 2018-11-02 LAB — URINALYSIS, REFLEX TO MICROSCOPIC EXAM IF INDICATED
Bilirubin, UA: NEGATIVE
Blood, UA: NEGATIVE
Glucose, UA: NEGATIVE
Ketones UA: NEGATIVE
Leukocyte Esterase, UA: NEGATIVE
Nitrite, UA: NEGATIVE
Protein, UR: 100 — AB
Specific Gravity UA: 1.005 (ref 1.001–1.035)
Urine pH: 7 (ref 5.0–8.0)
Urobilinogen, UA: NEGATIVE mg/dL (ref 0.2–2.0)

## 2018-11-02 LAB — ECG 12-LEAD
Atrial Rate: 81 {beats}/min
P Axis: 35 degrees
P-R Interval: 142 ms
Q-T Interval: 378 ms
QRS Duration: 72 ms
QTC Calculation (Bezet): 439 ms
R Axis: 9 degrees
T Axis: 48 degrees
Ventricular Rate: 81 {beats}/min

## 2018-11-02 NOTE — Discharge Instructions (Signed)
Renal Insufficiency    You have been diagnosed with a kidney problem. This problem is called renal insufficiency.    This means that your kidneys are not working correctly. Your kidneys get rid of the waste from your body. Many things can cause kidney problems. These include high blood pressure, certain medicines and dehydration. .Kidney problems can also be caused by narrowing of the arteries that bring blood to them.    Your kidneys are not working as well as they should, but they are still doing their job. We feel that it's safe for you to go home. Make sure that your doctor knows about this. He or she will need to do more tests.    YOU SHOULD SEEK MEDICAL ATTENTION IMMEDIATELY, EITHER HERE OR AT THE NEAREST EMERGENCY DEPARTMENT, IF ANY OF THE FOLLOWING OCCURS:   You have shortness of breath or chest pain.   You have swelling in your abdomen (belly), legs or feet.   Shortness of breath that makes it hard to sleep at night.   You have fever (temperature higher than 100.11F / 38C).                 LE Edema Etiology Unknown    You have been seen today for swelling of your leg(s).    Despite the doctor's evaluation, the cause of the swelling is unknown.    There are many possible causes for leg swelling. Below is a description of some of these causes:   Deep Venous Thrombus (DVT): This is a blood clot in one of the deep veins of your leg. Symptoms can include leg pain and swelling. The diagnosis is often made with a leg ultrasound which can detect a blockage in the veins.   Congestive heart failure is a condition where some fluid builds up in the lungs because of a heart problem. The main symptom is shortness of breath which worsens when you lie down. You may also notice leg swelling and weight gain.   Cellulitis: This is a bacterial infection of the skin. Symptoms are usually redness, swelling, and warmth in the affected area. Some people get a fever (temperature higher than 100.11F / 38C) with this  infection.   Venous Stasis: This happens when blood pools in the legs for some time. The legs get swollen and sometimes get red. The swelling gets better at night because lying flat makes it easier for blood to return to the heart. During the day as a person stands or sits with their legs dangling down, the blood has trouble getting out of the legs again and swelling gets worse.   Low amounts of Albumin: Albumin is a type of protein that is carried in the blood stream. One thing it does is keep the fluid part of the blood from leaking out of the blood vessels and into the surrounding tissues. Low albumin can be from poor nutrition, alcoholism, liver disease or other chronic (ongoing) illnesses.    The exact cause of the swelling is not known even though tests may have been done here today. Even though the cause is not known, the doctor feels that it is OK for you to go home. You need to see your doctor or referral doctor for more tests.    YOU SHOULD SEEK MEDICAL ATTENTION IMMEDIATELY, EITHER HERE OR AT THE NEAREST EMERGENCY DEPARTMENT, IF ANY OF THE FOLLOWING OCCURS:   Your leg swelling gets worse.   Your legs get red and you have pain /  fever (temperature higher than 100.33F / 38C).   You develop any shortness of breath, chest pain or pressure over your heart.   You develop any shortness of breath, especially if there is pain in your chest when you breathe in.

## 2018-11-21 ENCOUNTER — Emergency Department: Payer: BC Managed Care – PPO

## 2018-11-21 ENCOUNTER — Emergency Department
Admission: EM | Admit: 2018-11-21 | Discharge: 2018-11-21 | Disposition: A | Discharge status: Home / Self Care | Payer: BC Managed Care – PPO | Attending: Emergency Medicine | Admitting: Emergency Medicine

## 2018-11-21 DIAGNOSIS — Z794 Long term (current) use of insulin: Secondary | ICD-10-CM | POA: Insufficient documentation

## 2018-11-21 DIAGNOSIS — N189 Chronic kidney disease, unspecified: Secondary | ICD-10-CM | POA: Insufficient documentation

## 2018-11-21 DIAGNOSIS — E1122 Type 2 diabetes mellitus with diabetic chronic kidney disease: Secondary | ICD-10-CM | POA: Insufficient documentation

## 2018-11-21 DIAGNOSIS — R0789 Other chest pain: Secondary | ICD-10-CM

## 2018-11-21 DIAGNOSIS — I10 Essential (primary) hypertension: Secondary | ICD-10-CM

## 2018-11-21 DIAGNOSIS — Z7982 Long term (current) use of aspirin: Secondary | ICD-10-CM | POA: Insufficient documentation

## 2018-11-21 DIAGNOSIS — I129 Hypertensive chronic kidney disease with stage 1 through stage 4 chronic kidney disease, or unspecified chronic kidney disease: Secondary | ICD-10-CM | POA: Insufficient documentation

## 2018-11-21 DIAGNOSIS — R42 Dizziness and giddiness: Secondary | ICD-10-CM | POA: Insufficient documentation

## 2018-11-21 LAB — COMPREHENSIVE METABOLIC PANEL
ALT: 10 U/L (ref 0–55)
AST (SGOT): 13 U/L (ref 5–34)
Albumin/Globulin Ratio: 1.1 (ref 0.9–2.2)
Albumin: 3.8 g/dL (ref 3.5–5.0)
Alkaline Phosphatase: 48 U/L (ref 37–106)
Anion Gap: 8 (ref 5.0–15.0)
BUN: 35 mg/dL — ABNORMAL HIGH (ref 7.0–19.0)
Bilirubin, Total: 0.4 mg/dL (ref 0.2–1.2)
CO2: 21 mEq/L — ABNORMAL LOW (ref 22–29)
Calcium: 8.9 mg/dL (ref 8.5–10.5)
Chloride: 110 mEq/L (ref 100–111)
Creatinine: 2.3 mg/dL — ABNORMAL HIGH (ref 0.6–1.0)
Globulin: 3.5 g/dL (ref 2.0–3.6)
Glucose: 89 mg/dL (ref 70–100)
Potassium: 4.5 mEq/L (ref 3.5–5.1)
Protein, Total: 7.3 g/dL (ref 6.0–8.3)
Sodium: 139 mEq/L (ref 136–145)

## 2018-11-21 LAB — CBC AND DIFFERENTIAL
Absolute NRBC: 0 10*3/uL (ref 0.00–0.00)
Basophils Absolute Automated: 0.02 10*3/uL (ref 0.00–0.08)
Basophils Automated: 0.3 %
Eosinophils Absolute Automated: 0.06 10*3/uL (ref 0.00–0.44)
Eosinophils Automated: 0.8 %
Hematocrit: 27.7 % — ABNORMAL LOW (ref 34.7–43.7)
Hgb: 9 g/dL — ABNORMAL LOW (ref 11.4–14.8)
Immature Granulocytes Absolute: 0.02 10*3/uL (ref 0.00–0.07)
Immature Granulocytes: 0.3 %
Lymphocytes Absolute Automated: 2.01 10*3/uL (ref 0.42–3.22)
Lymphocytes Automated: 28 %
MCH: 27.8 pg (ref 25.1–33.5)
MCHC: 32.5 g/dL (ref 31.5–35.8)
MCV: 85.5 fL (ref 78.0–96.0)
MPV: 10.8 fL (ref 8.9–12.5)
Monocytes Absolute Automated: 0.36 10*3/uL (ref 0.21–0.85)
Monocytes: 5 %
Neutrophils Absolute: 4.72 10*3/uL (ref 1.10–6.33)
Neutrophils: 65.6 %
Nucleated RBC: 0 /100 WBC (ref 0.0–0.0)
Platelets: 198 10*3/uL (ref 142–346)
RBC: 3.24 10*6/uL — ABNORMAL LOW (ref 3.90–5.10)
RDW: 13 % (ref 11–15)
WBC: 7.19 10*3/uL (ref 3.10–9.50)

## 2018-11-21 LAB — GFR: EGFR: 27.2

## 2018-11-21 LAB — B-TYPE NATRIURETIC PEPTIDE: B-Natriuretic Peptide: 42 pg/mL (ref 0–100)

## 2018-11-21 LAB — TROPONIN I: Troponin I: <0.01 ng/mL (ref 0.00–0.05)

## 2018-11-21 LAB — URINE BHCG POC: Urine bHCG POC: NEGATIVE

## 2018-11-21 LAB — HEMOLYSIS INDEX: Hemolysis Index: 4 (ref 0–18)

## 2018-11-21 MED ORDER — ACETAMINOPHEN 500 MG PO TABS
1000.0000 mg | ORAL_TABLET | Freq: Once | ORAL | Status: AC
Start: 2018-11-21 — End: 2018-11-21
  Administered 2018-11-21: 17:00:00 1000 mg via ORAL
  Filled 2018-11-21: qty 2

## 2018-11-21 MED ORDER — LIDOCAINE 5 % EX PTCH
1.00 | MEDICATED_PATCH | CUTANEOUS | Status: DC
Start: 2018-11-21 — End: 2018-11-21
  Administered 2018-11-21: 17:00:00 1 via TRANSDERMAL
  Filled 2018-11-21: qty 1

## 2018-11-21 MED ORDER — LIDOCAINE 5 % EX PTCH
1.00 | MEDICATED_PATCH | Freq: Two times a day (BID) | CUTANEOUS | 0 refills | Status: DC | PRN
Start: 2018-11-21 — End: 2019-05-11

## 2018-11-21 NOTE — ED Provider Notes (Signed)
EMERGENCY DEPARTMENT HISTORY AND PHYSICAL EXAM     None        Date: 11/21/2018  Patient Name: Elizabeth Dunlap    History of Presenting Illness     Chief Complaint   Patient presents with   . Chest Pain       History Provided By: Patient    Chief Complaint: cp  Duration: 5 days  Timing:  Intermittent  Location: L lower chest  Quality: tight  Severity: Moderate      Additional History: Elizabeth DRIVER is a 48 y.o. female presenting to the ED with L lower chest pain intermittent x 5 days. Does not occur with exertion. Sometimes feels sob. Denies fevers, cough, abd pain, n/v/d. Has had chronic intermittent bilat ankle swelling for mths which is unchanged. She was told by her cardiologist in Feb it was not related to her heart and she was cleared for gastric bypass surgery. She reports normal treadmill stress test and echo oct 2019.    Pt also reports intermittent dizziness for mths without ha, vision changes, focal weakness or numbness.    She did not take her bp med today.    Pt states this cp is different from what led to her cardiac cath/stent.    PCP: Elizabeth Horsfall, MD  SPECIALISTS: Dr. Theda Dunlap (cards at [facility] in [facility])    No current facility-administered medications for this encounter.      Current Outpatient Medications   Medication Sig Dispense Refill   . aspirin EC 81 MG EC tablet Take 81 mg by mouth     . candesartan (ATACAND) 8 MG tablet Take 8 mg by mouth daily     . carvedilol (COREG) 25 MG tablet Take 25 mg by mouth     . Cholecalciferol (VITAMIN D Dunlap) Take 5,000 Units by mouth every other day.        . clopidogrel (PLAVIX) 75 mg tablet Take 75 mg by mouth daily     . hydroCHLOROthiazide (HYDRODIURIL) 25 MG tablet Take 25 mg by mouth daily     . insulin aspart (NOVOLOG) 100 UNIT/ML injection Inject into the skin 3 (three) times daily before meals     . Insulin Glargine (LANTUS SC) Inject 40 Units into the skin 2 (two) times daily.        . insulin glargine 100 UNIT/ML injection pen  Inject into the skin nightly     . insulin lispro (HUMALOG) 100 UNIT/ML injection Inject 16 Units into the skin 3 (three) times daily before meals.     . IRON Dunlap Take 65 mg by mouth daily.        Elizabeth Kitchen LACTOBACILLUS Dunlap Take 1 capsule by mouth daily.     Elizabeth Kitchen lidocaine (LIDODERM) 5 % Place 1 patch onto the skin every 12 (twelve) hours as needed (chest wall pain) Remove & Discard patch within 12 hours or as directed by MD 6 patch 0   . ondansetron (ZOFRAN-ODT) 4 MG disintegrating tablet Take 1 tablet (4 mg total) by mouth every 6 (six) hours as needed for Nausea. 8 tablet 0   . rosuvastatin (CRESTOR) 40 MG tablet Take 40 mg by mouth daily     . valsartan-hydrochlorothiazide (DIOVAN-HCT) 80-12.5 MG per tablet Take 1 tablet by mouth daily.         Past History     Past Medical History:  Past Medical History:   Diagnosis Date   . Anemia    . Asthma without  status asthmaticus     childhood   . Chronic kidney disease    . Diabetes mellitus type II, controlled    . DKA (diabetic ketoacidoses)    . GERD (gastroesophageal reflux disease)     occ.   . Vision abnormalities        Past Surgical History:  Past Surgical History:   Procedure Laterality Date   . CESAREAN SECTION     . CORONARY ANGIOPLASTY WITH STENT PLACEMENT     . HYSTEROSCOPY, ENDOMETRIAL ABLATION, THERMACHOICE  10/08/2012    Procedure: HYSTEROSCOPY, ENDOMETRIAL ABLATION, THERMACHOICE;  Surgeon: Elizabeth Grooms, MD;  Location: [facility];  Service: Gynecology;  Laterality: N/A;  thermachoice       Family History:  No family history on file.    Social History:  Social History     Tobacco Use   . Smoking status: Never Smoker   . Smokeless tobacco: Never Used   Substance Use Topics   . Alcohol use: No   . Drug use: No       Allergies:  Allergies   Allergen Reactions   . Penicillins Other (See Comments)     UNKNOWN   . Ace Inhibitors      cough       Review of Systems     Review of Systems   Constitutional: Negative for fever.   HENT: Negative for congestion, rhinorrhea  and sore throat.    Respiratory: Positive for shortness of breath. Negative for cough.    Cardiovascular: Positive for chest pain and leg swelling.   Gastrointestinal: Negative for abdominal pain, diarrhea, nausea and vomiting.   Neurological: Positive for dizziness. Negative for syncope, speech difficulty, weakness, light-headedness, numbness and headaches.   All other systems reviewed and are negative.      Physical Exam   BP 174/78   Pulse 79   Temp 99.5 F (37.5 C) (Oral)   Resp 17   Ht 5\' 5"  (1.651 m)   Wt 105 kg   SpO2 100%   BMI 38.52 kg/m     Physical Exam   Constitutional: Patient is oriented to person, place, and time and well-developed, well-nourished, and in no distress.   Head: Normocephalic and atraumatic.   Eyes: EOM are normal. Pupils are equal, round, and reactive to light. pink subconj. No scleral icterus  ENT: OP clear, MMM  Neck: Normal range of motion. Neck supple. No JVD  Cardiovascular: Normal rate and regular rhythm. No murmurs or rubs.  Pulmonary/Chest: Effort normal and breath sounds normal. No respiratory distress. Reproducible L chest wall ttp  Abdominal: Soft. There is no tenderness. No rebound or guarding.  Musculoskeletal: Normal range of motion. No lower extremity edema.  Neurological: Patient is alert and oriented to person, place, and time. GCS score is 15.   Skin: Skin is warm and dry.       Diagnostic Study Results     Labs -     Results     Procedure Component Value Units Date/Time    Urine BHCG POC [[id number]] Collected:  11/21/18 1719     Updated:  11/21/18 1725     Urine bHCG POC Negative    B-type Natriuretic Peptide [[id number]] Collected:  11/21/18 1452    Specimen:  Blood Updated:  11/21/18 1634     B-Natriuretic Peptide 42 pg/mL     Troponin I [[id number]] Collected:  11/21/18 1452    Specimen:  Blood Updated:  11/21/18 1529  Troponin I <0.01 ng/mL     Hemolysis index [[id number]] Collected:  11/21/18 1452     Updated:  11/21/18 1522     Hemolysis Index 4    GFR  [[id number]] Collected:  11/21/18 1452     Updated:  11/21/18 1522     EGFR 27.2    Comprehensive metabolic panel [[id number]]  (Abnormal) Collected:  11/21/18 1452    Specimen:  Blood Updated:  11/21/18 1522     Glucose 89 mg/dL      BUN 35.0 mg/dL      Creatinine 2.3 mg/dL      Sodium 139 mEq/L      Potassium 4.5 mEq/L      Chloride 110 mEq/L      CO2 21 mEq/L      Calcium 8.9 mg/dL      Protein, Total 7.3 g/dL      Albumin 3.8 g/dL      AST (SGOT) 13 U/L      ALT 10 U/L      Alkaline Phosphatase 48 U/L      Bilirubin, Total 0.4 mg/dL      Globulin 3.5 g/dL      Albumin/Globulin Ratio 1.1     Anion Gap 8.0    CBC and differential [[id number]]  (Abnormal) Collected:  11/21/18 1452    Specimen:  Blood Updated:  11/21/18 1501     WBC 7.19 x10 3/uL      Hgb 9.0 g/dL      Hematocrit 27.7 %      Platelets 198 x10 3/uL      RBC 3.24 x10 6/uL      MCV 85.5 fL      MCH 27.8 pg      MCHC 32.5 g/dL      RDW 13 %      MPV 10.8 fL      Neutrophils 65.6 %      Lymphocytes Automated 28.0 %      Monocytes 5.0 %      Eosinophils Automated 0.8 %      Basophils Automated 0.3 %      Immature Granulocyte 0.3 %      Nucleated RBC 0.0 /100 WBC      Neutrophils Absolute 4.72 x10 3/uL      Abs Lymph Automated 2.01 x10 3/uL      Abs Mono Automated 0.36 x10 3/uL      Abs Eos Automated 0.06 x10 3/uL      Absolute Baso Automated 0.02 x10 3/uL      Absolute Immature Granulocyte 0.02 x10 3/uL      Absolute NRBC 0.00 x10 3/uL           Radiologic Studies -   Radiology Results (24 Hour)     Procedure Component Value Units Date/Time    CT Head WO Contrast [[id number]] Collected:  11/21/18 1641    Order Status:  Completed Updated:  11/21/18 1648    Narrative:       Clinical History:  Dizzy     Examination:  CT HEAD WO CONTRAST    TECHNIQUE:  5 mm helical images obtained from the skull base through the vertex  without contrast. 3 mm sagittal and coronal reformatted images provided.   CT images were acquired using Automated Exposure Control for dose   reduction.     COMPARISON:   None available    FINDINGS:     There is no evidence of acute intracranial hemorrhage,  extra-axial  collection, mass effect, midline shift, herniation or hydrocephalus. The  ventricles, sulci and cisterns are age appropriate.  The gray-white  differentiation is intact.   The visualized paranasal sinuses and  mastoid air cells are clear.   The surrounding soft tissues and osseous  structures are unremarkable.        Impression:           No acute abnormality detected.    Gillermina Phy, MD   11/21/2018 4:44 PM    XR Chest  AP Portable [[id number]] Collected:  11/21/18 1446    Order Status:  Completed Updated:  11/21/18 1450    Narrative:       INDICATION: Chest pain.    TECHNIQUE: One view.    FINDINGS: No acute pulmonary infiltrates are demonstrated.  The  cardiomediastinal silhouette is within normal limits.  The costophrenic  angles are clear.      Impression:        No acute pulmonary infiltrates are demonstrated.     Steward Drone, MD   11/21/2018 2:46 PM      .    Medical Decision Making   I am the first provider for this patient.    I reviewed the vital signs, available nursing notes, past medical history, past surgical history, family history and social history.    Vital Signs-Reviewed the patient's vital signs.     Patient Vitals for the past 12 hrs:   BP Temp Pulse Resp   11/21/18 1659 174/78 - 79 17   11/21/18 1600 186/78 - 80 20   11/21/18 1500 166/73 - 82 (!) 32   11/21/18 1447 171/79 - 79 18   11/21/18 1420 (!) 172/91 99.5 F (37.5 C) 90 20       Pulse Oximetry Analysis - Normal 100% on RA        EKG:  Interpreted by the EP.   Time Interpreted: 2:25p   Rate:  80   Rhythm: Normal Sinus Rhythm     Interpretation: no st elev or depressions. Poor R wave progression anterolat   Comparison: unchanged to 11/01/18        Clinical Decision Support:   Heart Score      Value   History  0   EKG  1   Risk Factors  2   Total (with age)  4   Onset of pain (time of START of last episode of  chest pain)?  >6 hrs ago          Old Medical Records: Nursing notes.     ED Course:   5p- d/w pt. The patient verbalizes understanding of the test results, short and long term treatment plan, indications to return to emergency department, need for immediate follow up, and possible medication side effects. The patient agrees with discharge home at this time.    Provider Notes (Initial Assessment): L cp reproducible with chest wall palpation, suspect msk cp. With covid pandemic, atypical cp with neg tn and unchanged ecg and neg treadmill stress test 6 mths ago and sx different from cp requiring stents last time doubt acs and will [place] for outpt follow up with her cards MD. Doubt tad, PE, chf.Prov         Diagnosis     Clinical Impression:   1. Chest wall pain    2. Dizziness    3. Chronic kidney disease, unspecified CKD stage    4. Essential hypertension  Treatment Plan:   ED Disposition     ED Disposition Condition Date/Time Comment    Discharge  Fri Nov 21, 2018  5:12 PM Hubbard Hartshorn discharge to home/self care.    Condition at disposition: Stable            _______________________________  Attestations:     None        I am the first provider for this patient.    Burnett Corrente, MD is the primary emergency doctor of record.    I reviewed the vital signs, available nursing notes, past medical history, past surgical history, family history and social history.       Burnett Corrente, MD  11/21/18 2000

## 2018-11-21 NOTE — Discharge Instructions (Signed)
Dizziness, Nonspecific    You have been seen for dizziness.     Dizziness can mean different things to different people. Some people use dizziness to mean the feeling of spinning when there is no actual movement. This often causes nausea (feeling sick). The medical term for this is "vertigo." Others people use the word dizzy to mean "feeling lightheaded," like you might faint. This feeling is usually made better when lying down. For some people, neither of these describes how they are feeling. It can just be a feeling that makes you unsteady. This feeling is common in older people. It can be caused by a number of things. These include poor vision or hearing, foot problems and arthritis. It can also be caused by middle ear or sinus problems. The feeling can come and go.    Dizziness is also caused by more serious things. This includes strokes and heart problems.    It is NEVER normal to have the kind of dizziness you have today together with:   Chest pain.   Problems walking because of problems with balance. Especially if you are falling to one side.   Weakness, numbness or tingling in a part of your body.   Drooping of one side of your face.   Confusion.   Severe headache.   Problems speaking.    If you have these symptoms, it is VERY IMPORTANT to go to the nearest emergency department.    Your tests today were negative (normal). This means we found no life-threatening causes for your dizziness. It is OK for you to go home.    See your primary care doctor for more work-up of your dizziness.     YOU SHOULD SEEK MEDICAL ATTENTION IMMEDIATELY, EITHER HERE OR AT THE NEAREST EMERGENCY DEPARTMENT, IF ANY OF THE FOLLOWING OCCUR:   You cannot speak clearly (slurring), one side of your face droops or you feel weak in the arms or legs (especially on one side).   You have problems with your balance.   You have problems hearing or there is ringing or a feeling of fullness in your ear.   You lose consciousness  ("pass out" or faint).   You have severe headache with dizziness.   You have fever (temperature higher than 100.60F / 38C).   You fall and hit your head.               Hypertension    You have been diagnosed with elevated blood pressure.    The medical term for high blood pressure is hypertension. Many people feel anxious or uncomfortable about being at the hospital. If you feel anxious today, this could make your blood pressure appear high, even if your blood pressure is usually normal. Check your blood pressure several more times when you are not feeling stress. Keep a record of these readings and give this information to your regular doctor. He or she will decide whether you have hypertension that requires medical treatment.    If your blood pressure becomes extremely high all of a sudden, you will probably notice symptoms. In fact, very high blood pressure is a medical emergency. Most people with hypertension have blood pressure that is only a little too high. Mild high blood pressure does not cause specific symptoms. Instead, the effects of hypertension develop slowly over time. Untreated hypertension can affect the heart, brain, kidneys, eyes, and blood vessels. Unfortunately, by the time side-effects become noticeable, the body has already been damaged. This is why hypertension  is called "the silent killer!"    It is important to follow up with your regular doctor. Check your blood pressure several times in the next 1 to 2 weeks and tell your doctor about the results. It may be helpful to keep a log or a journal where you can write down your blood pressures. Note the time of day and the activity you were doing when the reading was taken.    YOU SHOULD SEEK MEDICAL ATTENTION IMMEDIATELY, EITHER HERE OR AT THE NEAREST EMERGENCY DEPARTMENT, IF ANY OF THE FOLLOWING OCCURS:   You have a headache.   You have chest pain.   You are short of breath or have trouble breathing.    You feel weak, especially  on only one side of the body.   Your symptoms get worse or you have other concerns.               Musculoskeletal Chest Pain    You have been diagnosed with musculoskeletal chest pain.    Your pain is due to an injury or inflammation (swelling) of the muscles, ligaments, cartilage (soft bone), or bone in your chest. The pain is usually sharp and knife-like and becomes worse with twisting, bending, or moving. It commonly occurs in a small area, and can be irritated by pressing on it. There is usually no shortness of breath, lightheadedness, weakness, or sweaty feeling. Some children will have pain when taking a deep breath or when coughing. Exercise usually does not affect these symptoms.    Musculoskeletal chest pain is treated with anti-inflammatory medications like ibuprofen (Advil or Motrin) or naproxen (Aleve). Other pain medications are usually not needed. Depending on the reason for your symptoms, either warm or cool compresses (damp washcloths laid on the skin) may be helpful.    Most musculoskeletal chest pain improves over several days.    YOU SHOULD SEEK MEDICAL ATTENTION IMMEDIATELY, EITHER HERE OR AT THE NEAREST EMERGENCY DEPARTMENT, IF ANY OF THE FOLLOWING OCCURS:   Your pain gets worse.   Your pain makes you feel short of breath, nauseated, or sweaty.   You notice that your pain gets worse as you walk, go up stairs, or exert yourself.   You have any weakness or lightheadedness with your pain.   Your pain makes breathing difficult.   You develop a swollen leg.   Your symptoms get worse or you have other concerns.     If you can't follow up with your doctor, or if at any time you feel you need to be rechecked or seen again, come back here or go to the nearest emergency department.

## 2018-11-21 NOTE — ED Triage Notes (Signed)
Elizabeth Dunlap is a 48 y.o. female who presents to ED with c/o left sided chest pain for the past week. Pt reports exertional SOB for the past week. Seen in this ED on 3/21 for ankle swelling. Seen at Urgent Care today who advised pt to come in for further eval. Denies fevers, sore throat or cough.     BP (!) 172/91   Pulse 90   Temp 99.5 F (37.5 C) (Oral)   Resp 20   Ht 5\' 5"  (1.651 m)   Wt 105 kg   SpO2 100%   BMI 38.52 kg/m

## 2018-11-22 ENCOUNTER — Telehealth: Payer: Self-pay

## 2018-11-23 LAB — ECG 12-LEAD
Atrial Rate: 80 {beats}/min
P Axis: 49 degrees
P-R Interval: 144 ms
Q-T Interval: 360 ms
QRS Duration: 70 ms
QTC Calculation (Bezet): 415 ms
R Axis: 6 degrees
T Axis: 28 degrees
Ventricular Rate: 80 {beats}/min

## 2019-04-27 ENCOUNTER — Ambulatory Visit (INDEPENDENT_AMBULATORY_CARE_PROVIDER_SITE_OTHER): Payer: BC Managed Care – PPO | Admitting: Vascular Surgery

## 2019-04-27 ENCOUNTER — Encounter (INDEPENDENT_AMBULATORY_CARE_PROVIDER_SITE_OTHER): Payer: Self-pay

## 2019-04-27 NOTE — Progress Notes (Addendum)
:  Room        VASCULAR SURGERY PATIENT FORM       Appt Date/Time: 05/18/2019 @ 3:15pm  PCP: Cheryle Horsfall, MD  Referring Physician:  Dr.Nicole Kathryne Hitch Patient  Follow-Up X VIDEO  [phone] Post-op   [place] - [employer] X 10/02-Venous/ABI Other Imaging     Chief Complaint/HPI: F/U BLE edema when on feet for extended periods of time & paresthesias on the dorsum of the right foot. Date of Last Visit: 05/11/2019    Allergies   Allergen Reactions    Penicillins Other (See Comments)     UNKNOWN    Ace Inhibitors      cough        Selected Medications:    Coumadin Plavix X Eliquis Xarelto Aspirin X Fish Oil Lovenox   Insulin X Metformin Other Diabetes Atorvastatin  Other Cholesterol  X Crestor   MHx:  Stroke/TIA/Seizures Thyroid  Diabetes X   Myocardial Infarction Arrhythmia  COPD/Asthma X (other lung)   Kidney Disease X  Liver Disease DVT   Wounds Musculoskeletal (spine) Cancer             Smoker           Former Smoker      X    Never Smoker    Vital Signs this Visit Pulses  HT WT HR   Rad Ulnar Brach Fem Pop DP PT       R          BP L BP R SpO2  L                        Imaging results:           Plan of Care:

## 2019-05-11 ENCOUNTER — Encounter (INDEPENDENT_AMBULATORY_CARE_PROVIDER_SITE_OTHER): Payer: Self-pay | Admitting: Vascular Surgery

## 2019-05-11 ENCOUNTER — Ambulatory Visit (INDEPENDENT_AMBULATORY_CARE_PROVIDER_SITE_OTHER): Payer: BC Managed Care – PPO | Admitting: Vascular Surgery

## 2019-05-11 VITALS — BP 128/82 | HR 88 | Temp 97.4°F | Ht 65.0 in | Wt 216.4 lb

## 2019-05-11 DIAGNOSIS — R600 Localized edema: Secondary | ICD-10-CM

## 2019-05-11 DIAGNOSIS — R200 Anesthesia of skin: Secondary | ICD-10-CM

## 2019-05-11 NOTE — Progress Notes (Signed)
[facility]    Chief Complaint   Patient presents with   . Numbness     Consult bilateral hand numbness-Tingling R foot   . Swelling In Extremities     BLE      History of Present Illness     Elizabeth Dunlap is a 48 y.o. female who presents to the office today as a new patient for evaluation of right lower extremity perfusion prior to undergoing bariatric surgery on June 02, 2019.  The patient has complaints of tingling, numbness and discomfort to the top of the right foot.  The patient also complains of bilateral ankle and foot edema, which is definitely worsened when sitting for long periods of time or standing for long periods time.  She spent the weekend with her legs elevated, and is happy to report her ankles never look skinnier.  The patient denies symptoms of claudication, ischemic rest pain and tissue loss.  She denies any significant heaviness or achiness of the bilateral legs.  She is never been diagnosed with a deep vein thrombosis or superficial thrombophlebitis.  She is never had any imaging to evaluate the arteries or veins in her lower extremities.    The patient does have a longstanding history of diabetes, followed by endocrinology.  It has been previously suggested that she may have neuropathy of the bilateral hands and feet, but she is never been officially diagnosed.  She also has a history of coronary artery disease with a stent in her heart.    Past Medical History     Past Medical History:   Diagnosis Date   . Anemia    . Asthma without status asthmaticus     childhood   . Chronic kidney disease    . Diabetes mellitus type II, controlled    . DKA (diabetic ketoacidoses)    . GERD (gastroesophageal reflux disease)     occ.   . Vision abnormalities        Past Surgical History:   Procedure Laterality Date   . CESAREAN SECTION     . CORONARY ANGIOPLASTY WITH STENT PLACEMENT     . HYSTEROSCOPY, ENDOMETRIAL ABLATION, THERMACHOICE  10/08/2012    Procedure: HYSTEROSCOPY, ENDOMETRIAL  ABLATION, THERMACHOICE;  Surgeon: Larae Grooms, MD;  Location: [facility];  Service: Gynecology;  Laterality: N/A;  thermachoice       Allergies     Allergies   Allergen Reactions   . Penicillins Other (See Comments)     UNKNOWN   . Ace Inhibitors      cough       Medications       Current Outpatient Medications:   .  aspirin EC 81 MG EC tablet, Take 81 mg by mouth, Disp: , Rfl:   .  candesartan (ATACAND) 32 MG tablet, Take 32 mg by mouth daily  , Disp: , Rfl:   .  carvedilol (COREG) 25 MG tablet, Take 25 mg by mouth, Disp: , Rfl:   .  Cholecalciferol (VITAMIN D PO), Take 5,000 Units by mouth every other day.  , Disp: , Rfl:   .  clopidogrel (PLAVIX) 75 mg tablet, Take 75 mg by mouth daily, Disp: , Rfl:   .  hydroCHLOROthiazide (HYDRODIURIL) 25 MG tablet, Take 25 mg by mouth daily, Disp: , Rfl:   .  insulin glargine 100 UNIT/ML injection pen, Inject into the skin nightly, Disp: , Rfl:   .  insulin lispro (HUMALOG) 100 UNIT/ML injection, Inject 16  Units into the skin 3 (three) times daily before meals., Disp: , Rfl:   .  IRON PO, Take 65 mg by mouth daily.  , Disp: , Rfl:   .  rosuvastatin (CRESTOR) 40 MG tablet, Take 40 mg by mouth daily, Disp: , Rfl:     Review of Systems     Constitutional: Negative for fevers and chills  Skin: No rash or lesions  Respiratory: Negative for cough, wheezing, or hemoptysis  Cardiovascular: Negative for chest pain and dyspnea  Gastrointestinal: Negative for abdominal pain, nausea, vomiting and diarrhea  Musculoskeletal:  No arthritic symptoms  All other systems were reviewed and are negative except what is stated in the HPI    Physical Exam     Vitals:    05/11/19 1035   BP: 128/82   Pulse: 88   Temp:    SpO2:        Body mass index is 36.01 kg/m.    General:  Patient appears their stated age, well-nourished.  Alert and in no apparent distress.  Lungs: Respiratory effort unlabored, chest expansion symmetric.  Cardiac: RRR, no carotid bruits, no JVD.   Abd: Soft, nondistended,  nontender.  No guarding or rebound, No mid line pulsatile mass   Extremities:Full ROM, symmetric, warm  Ischemic changes not present, Ulceration not present,  Gangrene not present, Peripheral edema: No  Pulses: Right: Femoral present Popliteal present, DP present, PT present  Pulses: Left: Femoral present, Popliteal present, DP present, PT present  Veins: Varicose veins Yes, hyperpigmentation No, lipo-dermatosclerosis No    Skin: clean, dry and intact  Neuro: Good insight and judgment, oriented to person, place, and time CN II-XII intact, gross motor and sensory intact     Labs     CBC:   WBC   Date/Time Value Ref Range Status   11/21/2018 02:52 PM 7.19 3.10 - 9.50 x10 3/uL Final     RBC   Date/Time Value Ref Range Status   11/21/2018 02:52 PM 3.24 (L) 3.90 - 5.10 x10 6/uL Final     Hgb   Date/Time Value Ref Range Status   11/21/2018 02:52 PM 9.0 (L) 11.4 - 14.8 g/dL Final     Hematocrit   Date/Time Value Ref Range Status   11/21/2018 02:52 PM 27.7 (L) 34.7 - 43.7 % Final     MCV   Date/Time Value Ref Range Status   11/21/2018 02:52 PM 85.5 78.0 - 96.0 fL Final     MCHC   Date/Time Value Ref Range Status   11/21/2018 02:52 PM 32.5 31.5 - 35.8 g/dL Final     RDW   Date/Time Value Ref Range Status   11/21/2018 02:52 PM 13 11 - 15 % Final     Platelets   Date/Time Value Ref Range Status   11/21/2018 02:52 PM 198 142 - 346 x10 3/uL Final       CMP:   Sodium   Date/Time Value Ref Range Status   11/21/2018 02:52 PM 139 136 - 145 mEq/L Final     Potassium   Date/Time Value Ref Range Status   11/21/2018 02:52 PM 4.5 3.5 - 5.1 mEq/L Final     Chloride   Date/Time Value Ref Range Status   11/21/2018 02:52 PM 110 100 - 111 mEq/L Final     CO2   Date/Time Value Ref Range Status   11/21/2018 02:52 PM 21 (L) 22 - 29 mEq/L Final     Glucose   Date/Time Value Ref Range  Status   11/21/2018 02:52 PM 89 70 - 100 mg/dL Final     Comment:     ADA guidelines for diabetes mellitus:  Fasting:  Equal to or greater than 126 mg/dL  Random:    Equal to or greater than 200 mg/dL       BUN   Date/Time Value Ref Range Status   11/21/2018 02:52 PM 35.0 (H) 7.0 - 19.0 mg/dL Final     Protein, Total   Date/Time Value Ref Range Status   11/21/2018 02:52 PM 7.3 6.0 - 8.3 g/dL Final     Alkaline Phosphatase   Date/Time Value Ref Range Status   11/21/2018 02:52 PM 48 37 - 106 U/L Final     AST (SGOT)   Date/Time Value Ref Range Status   11/21/2018 02:52 PM 13 5 - 34 U/L Final     ALT   Date/Time Value Ref Range Status   11/21/2018 02:52 PM 10 0 - 55 U/L Final     Anion Gap   Date/Time Value Ref Range Status   11/21/2018 02:52 PM 8.0 5.0 - 15.0 Final       Lipid Panel   Cholesterol   Date/Time Value Ref Range Status   12/23/2014 05:26 AM 236 (H) 0 - 199 mg/dL Final     Triglycerides   Date/Time Value Ref Range Status   12/23/2014 05:26 AM 123 34 - 149 mg/dL Final     HDL   Date/Time Value Ref Range Status   12/23/2014 05:26 AM 41 >40 mg/dL Final     Comment:     HDL:       Less than 40 mg/dL - Major risk heart disease       Greater than or equal to 60 mg/dL - Negative risk factor       for heart disease         Coags:   PT   Date/Time Value Ref Range Status   12/23/2014 05:26 AM 13.6 12.6 - 15.0 sec Final     PT INR   Date/Time Value Ref Range Status   12/23/2014 05:26 AM 1.0 0.9 - 1.1 Final     Comment:     Recommended Ranges for Protime INR:    2.0-3.0 for most medical and surgical thromboembolic states    8.8-8.9 for artificial heart valves  INR result may not represent exact Warfarin dosing level during  the transition period from Heparin to Warfarin therapy.  Recommend close clinical monitoring.         Imaging     No new imaging to review     Assessment and Plan     1. Bilateral leg edema  US Venous Insufficency Duplx Dopp Low Extrem Comp Bilat   2. Numbness of right foot  [place] Noninvas Low Extrem Art Dopp/Press/Wavefrms (Abi-Ppg) Ltd 1-2 Lvls     This patient is a 48 year old woman contemplating gastric bypass surgery who has paresthesias on the dorsum of the  right foot.  The patient also has edema at the ankle level bilaterally when she is on her feet for extended periods of time.  She has used light compression stockings in the past.  On physical examination the patient has palpable pedal pulses more prominent on the left side and no sign of chronic brawny induration at the ankle level.    Plan  1 arterial and venous studies in the vascular laboratory prior to her planned gastric surgery  2 consideration of venous intervention based  on the venous ultrasounds  3 resumption of use of light compression stockings  4 consideration of injection sclerotherapy for the patient's spider veins should she so desire    I hope this plan of care meets with your approval. Thank you for the opportunity to care for your patient.     Sincerely,     Trula Ore, M.D.     Malva Limes, FNP-BC    [facility]  [address]  [place], [place] [address]  T [phone]  F [phone]

## 2019-05-15 ENCOUNTER — Ambulatory Visit (INDEPENDENT_AMBULATORY_CARE_PROVIDER_SITE_OTHER): Payer: BC Managed Care – PPO

## 2019-05-15 DIAGNOSIS — R200 Anesthesia of skin: Secondary | ICD-10-CM

## 2019-05-15 DIAGNOSIS — R600 Localized edema: Secondary | ICD-10-CM

## 2019-05-18 ENCOUNTER — Encounter (INDEPENDENT_AMBULATORY_CARE_PROVIDER_SITE_OTHER): Payer: Self-pay | Admitting: Family

## 2019-05-18 ENCOUNTER — Telehealth (INDEPENDENT_AMBULATORY_CARE_PROVIDER_SITE_OTHER): Payer: BC Managed Care – PPO | Admitting: Family

## 2019-05-18 VITALS — BP 119/76 | HR 80 | Ht 65.0 in | Wt 220.0 lb

## 2019-05-18 DIAGNOSIS — R600 Localized edema: Secondary | ICD-10-CM

## 2019-05-18 DIAGNOSIS — I781 Nevus, non-neoplastic: Secondary | ICD-10-CM | POA: Insufficient documentation

## 2019-05-18 NOTE — Progress Notes (Signed)
[facility]    Chief Complaint   Patient presents with   . Follow-up      F/U BLE edema when on feet for extended periods of time & paresthesias on the dorsum of the right foot       Verbal consent has been obtained from the patient to conduct a video and telephone visit to minimize exposure to COVID-19: yes.     History of Present Illness     Elizabeth Dunlap is a 48 y.o. female who presents for a follow up appointment after completing a bilateral lower extremity venous insufficiency duplex and an ankle brachial index in our office on 05/15/2019. She has presented to the office a few weeks ago for concerns of bilateral lower extremity edema, that seemed to have worsened when she started working from home. She has been sitting for a long periods of time, with no breaks in between. She is also pending bariatric surgery and wanted to ensure there were no vascular concerns prior to surgical intervention.    Today, the patient notes that the edema has definitely improved with increasing her walking throughout the day. She has also had to drastically change her diet for her upcoming surgery and also notices improvement of the edema related to that.     She continues to deny claudication, rest pain and tissue loss. 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 She denies any other vascular concerns at this time.     Past Medical History     Past Medical History:   Diagnosis Date   . Anemia    . Asthma without status asthmaticus     childhood   . Chronic kidney disease    . Diabetes mellitus type II, controlled    . DKA (diabetic ketoacidoses)    . GERD (gastroesophageal reflux disease)     occ.   . Vision abnormalities        Past Surgical History:   Procedure Laterality Date   . CESAREAN SECTION     . CORONARY ANGIOPLASTY WITH STENT PLACEMENT     . HYSTEROSCOPY, ENDOMETRIAL ABLATION, THERMACHOICE  10/08/2012    Procedure: HYSTEROSCOPY, ENDOMETRIAL ABLATION,  THERMACHOICE;  Surgeon: Larae Grooms, MD;  Location: [facility];  Service: Gynecology;  Laterality: N/A;  thermachoice       Allergies     Allergies   Allergen Reactions   . Penicillins Other (See Comments)     UNKNOWN   . Ace Inhibitors      cough       Medications       Current Outpatient Medications:   .  aspirin EC 81 MG EC tablet, Take 81 mg by mouth, Disp: , Rfl:   .  candesartan (ATACAND) 32 MG tablet, Take 32 mg by mouth daily  , Disp: , Rfl:   .  carvedilol (COREG) 25 MG tablet, Take 25 mg by mouth, Disp: , Rfl:   .  Cholecalciferol (VITAMIN D PO), Take 5,000 Units by mouth every other day.  , Disp: , Rfl:   .  clopidogrel (PLAVIX) 75 mg tablet, Take 75 mg by mouth daily, Disp: , Rfl:   .  hydroCHLOROthiazide (HYDRODIURIL) 25 MG tablet, Take 25 mg by mouth daily, Disp: , Rfl:   .  insulin glargine 100 UNIT/ML injection pen, Inject into the skin nightly, Disp: , Rfl:   .  insulin lispro (HUMALOG) 100 UNIT/ML injection, Inject  16 Units into the skin 3 (three) times daily before meals., Disp: , Rfl:   .  IRON PO, Take 65 mg by mouth daily.  , Disp: , Rfl:   .  rosuvastatin (CRESTOR) 40 MG tablet, Take 40 mg by mouth daily, Disp: , Rfl:     Review of Systems     Constitutional: Negative for fevers and chills  Skin: No rash or lesions  Respiratory: Negative for cough, wheezing, or hemoptysis  Cardiovascular: Negative for chest pain and dyspnea  Gastrointestinal: Negative for abdominal pain, nausea, vomiting and diarrhea  Musculoskeletal:  Generalized discomfort.   All other systems were reviewed and are negative except what is stated in the HPI    Physical Exam     Vitals:    05/18/19 1448   BP: 119/76   Pulse: 80       Body mass index is 36.61 kg/m.    General:  Patient appears their stated age, well-nourished.  Alert and in no apparent distress.  Neuro: Good insight and judgment, oriented to person, place, and time.    This physical examination was performed via visualization.     Imaging     Bilateral  lower extremity venous insufficiency duplex performed in our office on 05/15/2019, revealed no evidence of deep vein thrombosis, deep or superficial venous insufficiency. This was a normal study, all vessels duplexed were patent and within normal limits.     Bilateral ankle brachial index was also performed. This imaging reveals a right ABI of 1.16 and a left ABI of 1.14. There is no evidence of peripheral arterial disease.     Assessment and Plan     This visit is being conducted via video and telephone.  Time spent in discussion: Est 10 min    1. Bilateral leg edema     2. Telangiectasia       Based on the information above, the patient has no evidence of venous insufficiency or peripheral arterial disease. The edema of the bilateral lower extremities is related to the long periods of time sitting and potentially attributed to her diet. She was encouraged to continue to remain active during the day. She was recommended to elevate the foot of the bed overnight. She should be utilizing compression therapy during the perioperative and post-operative periods.     The patients neuropathy complaints are also unassociated with peripheral arterial disease. These could be attributed to musculoskeletal complaints or her history of diabetes.    The patient has been cleared for bariatric surgery from a vascular standpoint. 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     I hope this plan of care meets with your approval. Thank you for the opportunity to care for your patient.     Sincerely,     Malva Limes, FNP-BC    [facility]  [address]  [place], [place] [address]  T [phone]  F [phone]

## 2019-11-11 ENCOUNTER — Telehealth (INDEPENDENT_AMBULATORY_CARE_PROVIDER_SITE_OTHER): Payer: Self-pay

## 2019-11-11 NOTE — Telephone Encounter (Signed)
Routed to [facility] to schedule. Advised that OV prior to slero is not needed unless patient requests.

## 2019-11-11 NOTE — Telephone Encounter (Signed)
-----   Message from Elaina Pattee, NP sent at 11/11/2019  1:57 PM EDT -----  No need for follow up, just schedule for sclerotherapy downstairs.  Thanks,  Benjamine Mola  ----- Message -----  From: Lenor Coffin, RN  Sent: 11/11/2019   1:38 PM EDT  To: Elaina Pattee, NP    Patient calling to schedule FU visit.  Do you want to see her first, or should patient just be scheduled for sclero with you?

## 2019-11-30 ENCOUNTER — Ambulatory Visit (INDEPENDENT_AMBULATORY_CARE_PROVIDER_SITE_OTHER): Payer: Self-pay | Admitting: Family

## 2019-12-03 ENCOUNTER — Ambulatory Visit (INDEPENDENT_AMBULATORY_CARE_PROVIDER_SITE_OTHER): Payer: BC Managed Care – PPO | Admitting: Family

## 2019-12-16 ENCOUNTER — Telehealth (INDEPENDENT_AMBULATORY_CARE_PROVIDER_SITE_OTHER): Payer: Self-pay

## 2019-12-16 NOTE — Telephone Encounter (Signed)
Called pt to verify reason for visit and to make sure that she knew that she was scheduled for a F/U w/ Dr.Neville rather than sclerotherapy that was previously suggested on 11/11/2019 encounter. Unable to LVM due to patient mailbox being full.

## 2019-12-18 ENCOUNTER — Ambulatory Visit (INDEPENDENT_AMBULATORY_CARE_PROVIDER_SITE_OTHER): Payer: BC Managed Care – PPO | Admitting: Vascular Surgery

## 2020-11-22 ENCOUNTER — Emergency Department: Payer: BC Managed Care – PPO

## 2020-11-22 ENCOUNTER — Emergency Department
Admission: EM | Admit: 2020-11-22 | Discharge: 2020-11-22 | Disposition: A | Discharge status: Home / Self Care | Payer: BC Managed Care – PPO | Attending: Emergency Medical Services | Admitting: Emergency Medical Services

## 2020-11-22 DIAGNOSIS — Z794 Long term (current) use of insulin: Secondary | ICD-10-CM | POA: Insufficient documentation

## 2020-11-22 DIAGNOSIS — T68XXXA Hypothermia, initial encounter: Secondary | ICD-10-CM | POA: Insufficient documentation

## 2020-11-22 DIAGNOSIS — N179 Acute kidney failure, unspecified: Secondary | ICD-10-CM | POA: Insufficient documentation

## 2020-11-22 DIAGNOSIS — E1122 Type 2 diabetes mellitus with diabetic chronic kidney disease: Secondary | ICD-10-CM | POA: Insufficient documentation

## 2020-11-22 DIAGNOSIS — Z136 Encounter for screening for cardiovascular disorders: Secondary | ICD-10-CM

## 2020-11-22 DIAGNOSIS — Z20822 Contact with and (suspected) exposure to covid-19: Secondary | ICD-10-CM | POA: Insufficient documentation

## 2020-11-22 DIAGNOSIS — N189 Chronic kidney disease, unspecified: Secondary | ICD-10-CM | POA: Insufficient documentation

## 2020-11-22 DIAGNOSIS — E11649 Type 2 diabetes mellitus with hypoglycemia without coma: Secondary | ICD-10-CM | POA: Insufficient documentation

## 2020-11-22 DIAGNOSIS — E162 Hypoglycemia, unspecified: Secondary | ICD-10-CM

## 2020-11-22 LAB — COMPREHENSIVE METABOLIC PANEL
ALT: 13 U/L (ref 0–55)
AST (SGOT): 21 U/L (ref 5–34)
Albumin/Globulin Ratio: 1.1 (ref 0.9–2.2)
Albumin: 3.6 g/dL (ref 3.5–5.0)
Alkaline Phosphatase: 72 U/L (ref 37–106)
Anion Gap: 9 (ref 5.0–15.0)
BUN: 48 mg/dL — ABNORMAL HIGH (ref 7.0–19.0)
Bilirubin, Total: 0.3 mg/dL (ref 0.2–1.2)
CO2: 20 mEq/L — ABNORMAL LOW (ref 22–29)
Calcium: 9.5 mg/dL (ref 8.5–10.5)
Chloride: 112 mEq/L — ABNORMAL HIGH (ref 100–111)
Creatinine: 3.3 mg/dL — ABNORMAL HIGH (ref 0.6–1.0)
Globulin: 3.4 g/dL (ref 2.0–3.6)
Glucose: 165 mg/dL — ABNORMAL HIGH (ref 70–100)
Potassium: 4.9 mEq/L (ref 3.5–5.1)
Protein, Total: 7 g/dL (ref 6.0–8.3)
Sodium: 141 mEq/L (ref 136–145)

## 2020-11-22 LAB — CBC AND DIFFERENTIAL
Absolute NRBC: 0 10*3/uL (ref 0.00–0.00)
Basophils Absolute Automated: 0.05 10*3/uL (ref 0.00–0.08)
Basophils Automated: 0.5 %
Eosinophils Absolute Automated: 0.06 10*3/uL (ref 0.00–0.44)
Eosinophils Automated: 0.5 %
Hematocrit: 32.7 % — ABNORMAL LOW (ref 34.7–43.7)
Hgb: 10.2 g/dL — ABNORMAL LOW (ref 11.4–14.8)
Immature Granulocytes Absolute: 0.05 10*3/uL (ref 0.00–0.07)
Immature Granulocytes: 0.5 %
Lymphocytes Absolute Automated: 2.12 10*3/uL (ref 0.42–3.22)
Lymphocytes Automated: 19.2 %
MCH: 27.9 pg (ref 25.1–33.5)
MCHC: 31.2 g/dL — ABNORMAL LOW (ref 31.5–35.8)
MCV: 89.3 fL (ref 78.0–96.0)
MPV: 11.5 fL (ref 8.9–12.5)
Monocytes Absolute Automated: 0.38 10*3/uL (ref 0.21–0.85)
Monocytes: 3.4 %
Neutrophils Absolute: 8.37 10*3/uL — ABNORMAL HIGH (ref 1.10–6.33)
Neutrophils: 75.9 %
Nucleated RBC: 0 /100 WBC (ref 0.0–0.0)
Platelets: 205 10*3/uL (ref 142–346)
RBC: 3.66 10*6/uL — ABNORMAL LOW (ref 3.90–5.10)
RDW: 12 % (ref 11–15)
WBC: 11.03 10*3/uL — ABNORMAL HIGH (ref 3.10–9.50)

## 2020-11-22 LAB — URINALYSIS REFLEX TO MICROSCOPIC EXAM - REFLEX TO CULTURE
Bilirubin, UA: NEGATIVE
Glucose, UA: NEGATIVE
Ketones UA: NEGATIVE
Leukocyte Esterase, UA: NEGATIVE
Nitrite, UA: NEGATIVE
Protein, UR: >=300 — AB
Specific Gravity UA: 1.025 (ref 1.001–1.035)
Urine pH: 7 (ref 5.0–8.0)
Urobilinogen, UA: 0.2 mg/dL (ref 0.2–2.0)

## 2020-11-22 LAB — LACTIC ACID, PLASMA: Lactic Acid: 0.6 mmol/L (ref 0.2–2.0)

## 2020-11-22 LAB — GLUCOSE WHOLE BLOOD - POCT
Whole Blood Glucose POCT: 67 mg/dL — ABNORMAL LOW (ref 70–100)
Whole Blood Glucose POCT: 190 mg/dL — ABNORMAL HIGH (ref 70–100)
Whole Blood Glucose POCT: 154 mg/dL — ABNORMAL HIGH (ref 70–100)
Whole Blood Glucose POCT: 131 mg/dL — ABNORMAL HIGH (ref 70–100)

## 2020-11-22 LAB — PT AND APTT
PT INR: 1 (ref 0.9–1.1)
PT: 11.6 s (ref 10.1–12.9)
PTT: 41 s — ABNORMAL HIGH (ref 27–39)

## 2020-11-22 LAB — T4, FREE: T4 Free: 0.94 ng/dL (ref 0.70–1.48)

## 2020-11-22 LAB — COVID-19 (SARS-COV-2): SARS CoV-2: NEGATIVE

## 2020-11-22 LAB — TROPONIN I: Troponin I: <0.01 ng/mL (ref 0.00–0.05)

## 2020-11-22 LAB — GFR: EGFR: 17.8

## 2020-11-22 LAB — LIPASE: Lipase: 34 U/L (ref 8–78)

## 2020-11-22 LAB — TSH: TSH: 1.19 u[IU]/mL (ref 0.35–4.94)

## 2020-11-22 NOTE — ED Notes (Signed)
Turned off the bear hugger. Pt is normothermic. Vitals stable.

## 2020-11-22 NOTE — ED Notes (Signed)
[facility] 154, Dr. He aware

## 2020-11-22 NOTE — ED Provider Notes (Signed)
EMERGENCY DEPARTMENT HISTORY AND PHYSICAL EXAM     None        ED Course      ED Course as of 11/22/20 1231   Tue Nov 22, 2020   0853 Pt still feels sleepy. Was able to stand to go to bathroom and for moving to CT earlier. This better than this AM. Pt is aware of worsening renal function and plans to f/u with her nephrologist.  [AH]   [phone] Per RN, pt ambulated fine, temp 96.4 F, will give warm oral rehydration.  [AH]      ED Course User Index  [AH] Elizabeth Dunlap, Elizabeth Going, MD PhD       12:25 pm, temp 98.7 F, pt feeling much better. Informed all test results including incidental findings.     Patient feels better. I have discussed all testing results and plan of care with patient. Patient agrees with Dunlap home and following up and return if worsening symptoms.           Provider Note:       R/o covid, r/o flu, r/o pna, r/o uti, r/o colitis, r/o electrolyte abn, r/o nstemi, r/o ich outpatient tx and f/u    Symptoms likely 2/2 to OD of short acting insulin overnight.       HISTORY OF PRESENT ILLNESS   Historian:Patient  Translator Used: No    50 y.o. female h/o ckd, dm on glipizide and prn insulin here b/c of feeling weak and almost passing out on waking this AM. Per patient, she was in a cold sweat this AM and tried to get up to change thermostat. Pt called son and 42 and was constantly feeling like she is Dunlap to pass out but "fought" to stay awake. Last night, she noted her BG was >300 and gave herself 12 units of insulin novolog prior to bed. She normally takes glipizide 2.5 mg bid and did take it yesterday. She did workout 2 days ago for about 1 hr and had no issues. She would occasionally get chest discomfort during work out but not this AM or last night.     1. Location of symptoms: general   2. Onset of symptoms: this AM  3. What was patient doing when symptoms started (Context): nothing  4. Severity: moderate  5. Timing: intermittent  6. Activities that worsen symptoms: none  7. Activities that improve symptoms:  none  8. Quality:   9. Radiation of symptoms:  10. Associated signs and Symptoms: see above  11. Are symptoms worsening? Yes    MEDICAL HISTORY     Past Medical History:  Past Medical History:   Diagnosis Date    Anemia     Asthma without status asthmaticus     childhood    Chronic kidney disease     Diabetes mellitus type II, controlled     DKA (diabetic ketoacidoses)     GERD (gastroesophageal reflux disease)     occ.    Vision abnormalities        Past Surgical History:  Past Surgical History:   Procedure Laterality Date    CESAREAN SECTION      CORONARY ANGIOPLASTY WITH STENT PLACEMENT      HYSTEROSCOPY, ENDOMETRIAL ABLATION, THERMACHOICE  10/08/2012    Procedure: HYSTEROSCOPY, ENDOMETRIAL ABLATION, THERMACHOICE;  Surgeon: Larae Grooms, MD;  Location: [facility];  Service: Gynecology;  Laterality: N/A;  thermachoice       Social History:  Social History  Socioeconomic History    Marital status: Single   Tobacco Use    Smoking status: Never Smoker    Smokeless tobacco: Never Used   Vaping Use    Vaping Use: Never used   Substance and Sexual Activity    Alcohol use: No    Drug use: No       Family History:  History reviewed. No pertinent family history.    Allergies:  Allergies   Allergen Reactions    Penicillins Other (See Comments)     UNKNOWN    Ace Inhibitors      cough       Outpatient Medication:  Previous Medications    ASPIRIN EC 81 MG EC TABLET    Take 81 mg by mouth    CANDESARTAN (ATACAND) 32 MG TABLET    Take 32 mg by mouth daily       CARVEDILOL (COREG) 25 MG TABLET    Take 25 mg by mouth    CHOLECALCIFEROL (VITAMIN D PO)    Take 5,000 Units by mouth every other day.       CLOPIDOGREL (PLAVIX) 75 MG TABLET    Take 75 mg by mouth daily    HYDROCHLOROTHIAZIDE (HYDRODIURIL) 25 MG TABLET    Take 25 mg by mouth daily    INSULIN GLARGINE 100 UNIT/ML INJECTION PEN    Inject into the skin nightly    INSULIN LISPRO (HUMALOG) 100 UNIT/ML INJECTION    Inject 16 Units into the skin 3  (three) times daily before meals.    IRON PO    Take 65 mg by mouth daily.       ROSUVASTATIN (CRESTOR) 40 MG TABLET    Take 40 mg by mouth daily         REVIEW OF SYSTEMS   ADD ROS  Near syncope  Hypoglycemia   All other systems reviewed and are negative.    PHYSICAL EXAM     Vitals:    11/22/20 0715   BP: (!) 179/94   Pulse: 71   Temp: (!) 96 F (35.6 C)   SpO2: 99%   RR: 15      Nursing note and vitals reviewed.  Constitutional:  Well developed, well nourished.   Head:  Atraumatic. Normocephalic.    Eyes:  PERRL. EOMI. Conjunctivae are not pale.  ENT:  Mucous membranes are moist and intact. Patent airway.  Neck:  Supple. Full ROM.    Cardiovascular:  Regular rate. Regular rhythm.   Respiratory:  No evidence of respiratory distress.   GI:  Soft and non-distended. There is no tenderness. No rebound, guarding, or rigidity.  Back:  Full ROM. Nontender.  MSK:  No edema. No cyanosis. No clubbing. Full range of motion in all extremities.  Skin:  Skin is warm and dry.  No diaphoresis. No rash.   Neurological:  Alert, awake, and appropriate. Normal speech. Motor normal.  Psychiatric:  Good eye contact. Normal interaction, affect, and behavior.    MEDICAL DECISION MAKING     Vital Signs: Reviewed the patients vital signs.   Nursing Notes: Reviewed and utilized available nursing notes.  Medical Records Reviewed: Reviewed available past medical records.      PROCEDURES        CARDIAC STUDIES        Monitor Strip  Interpreted by ED Physician  Rate: 67  Rhythm: SR   ST Changes: none     EKG Interpretation:    7:38 NSR at 66 bpm,  NAD, no LvH, qrs 70, qtc 452 (-) ST-T changes similar to ekg on 11/21/2018 as read by me.     IMAGING STUDIES      CT Abd/ Pelvis without Contrast   Final Result        1. No acute process in the abdomen or pelvis on this noncontrast   examination.   2. 9 mm hazy nodular opacity in the right lower lobe. This could be   postinfectious or postinflammatory or even related to a component of   atelectasis.  Suggest follow-up CT of the chest in 2-4 weeks.       Elizabeth Bapna, MD    11/22/2020 10:25 AM      CT Head WO Contrast   Final Result     No acute intracranial abnormality.      Elizabeth Bapna, MD    11/22/2020 8:25 AM      XR Chest AP Portable   Final Result         No focal consolidation, pleural effusion, or pneumothorax.      Cynda Familia, MD    11/22/2020 8:12 AM             PULSE OXIMETRY    Oxygen Saturation by Pulse Oximetry: 99%  Interventions: none  Interpretation: normal     EMERGENCY DEPT. MEDICATIONS      ED Medication Orders (From admission, onward)    None          LABORATORY RESULTS    Ordered and independently interpreted AVAILABLE laboratory tests. Please see results section in chart for full details.  Results for orders placed or performed during the hospital encounter of 11/22/20   COVID-19 (SARS-COV-2) ([facility])    Specimen: Nasopharynx; Nasopharyngeal Swab   Result Value Ref Range    Purpose of COVID testing Screening     SARS-CoV-2 Specimen Source Nasopharyngeal     SARS CoV-2 Negative    CBC and differential   Result Value Ref Range    WBC 11.03 (H) 3.10 - 9.50 x10 3/uL    Hgb 10.2 (L) 11.4 - 14.8 g/dL    Hematocrit 32.7 (L) 34.7 - 43.7 %    Platelets 205 142 - 346 x10 3/uL    RBC 3.66 (L) 3.90 - 5.10 x10 6/uL    MCV 89.3 78.0 - 96.0 fL    MCH 27.9 25.1 - 33.5 pg    MCHC 31.2 (L) 31.5 - 35.8 g/dL    RDW 12 11 - 15 %    MPV 11.5 8.9 - 12.5 fL    Neutrophils 75.9 None %    Lymphocytes Automated 19.2 None %    Monocytes 3.4 None %    Eosinophils Automated 0.5 None %    Basophils Automated 0.5 None %    Immature Granulocytes 0.5 None %    Nucleated RBC 0.0 0.0 - 0.0 /100 WBC    Neutrophils Absolute 8.37 (H) 1.10 - 6.33 x10 3/uL    Lymphocytes Absolute Automated 2.12 0.42 - 3.22 x10 3/uL    Monocytes Absolute Automated 0.38 0.21 - 0.85 x10 3/uL    Eosinophils Absolute Automated 0.06 0.00 - 0.44 x10 3/uL    Basophils Absolute Automated 0.05 0.00 - 0.08 x10 3/uL    Immature Granulocytes Absolute  0.05 0.00 - 0.07 x10 3/uL    Absolute NRBC 0.00 0.00 - 0.00 x10 3/uL   Comprehensive metabolic panel   Result Value Ref Range    Glucose 165 (H) 70 -  100 mg/dL    BUN 48.0 (H) 7.0 - 19.0 mg/dL    Creatinine 3.3 (H) 0.6 - 1.0 mg/dL    Sodium 141 136 - 145 mEq/L    Potassium 4.9 3.5 - 5.1 mEq/L    Chloride 112 (H) 100 - 111 mEq/L    CO2 20 (L) 22 - 29 mEq/L    Calcium 9.5 8.5 - 10.5 mg/dL    Protein, Total 7.0 6.0 - 8.3 g/dL    Albumin 3.6 3.5 - 5.0 g/dL    AST (SGOT) 21 5 - 34 U/L    ALT 13 0 - 55 U/L    Alkaline Phosphatase 72 37 - 106 U/L    Bilirubin, Total 0.3 0.2 - 1.2 mg/dL    Globulin 3.4 2.0 - 3.6 g/dL    Albumin/Globulin Ratio 1.1 0.9 - 2.2    Anion Gap 9.0 5.0 - 15.0   Urinalysis Reflex to Microscopic Exam- Reflex to Culture   Result Value Ref Range    Urine Type Urine, Clean Ca     Color, UA Yellow Colorless - Yellow    Clarity, UA Clear Clear - Hazy    Specific Gravity UA 1.025 1.001 - 1.035    Urine pH 7.0 5.0 - 8.0    Leukocyte Esterase, UA Negative Negative    Nitrite, UA Negative Negative    Protein, UR >=300 (A) Negative    Glucose, UA Negative Negative    Ketones UA Negative Negative    Urobilinogen, UA 0.2 0.2 - 2.0 mg/dL    Bilirubin, UA Negative Negative    Blood, UA Small (A) Negative    RBC, UA 0-2 0 - 5 /hpf    WBC, UA 0-5 0 - 5 /hpf    Squamous Epithelial Cells, Urine 0-5 0 - 25 /hpf   Lipase   Result Value Ref Range    Lipase 34 8 - 78 U/L   GFR   Result Value Ref Range    EGFR 17.8    Troponin I   Result Value Ref Range    Troponin I <0.01 0.00 - 0.05 ng/mL   PT/APTT   Result Value Ref Range    PT 11.6 10.1 - 12.9 sec    PT INR 1.0 0.9 - 1.1    PTT 41 (H) 27 - 39 sec   Lactic Acid   Result Value Ref Range    Lactic Acid 0.6 0.2 - 2.0 mmol/L   TSH   Result Value Ref Range    TSH 1.19 0.35 - 4.94 uIU/mL   T4, free   Result Value Ref Range    T4 Free 0.94 0.70 - 1.48 ng/dL   Glucose Whole Blood - POCT   Result Value Ref Range    Whole Blood Glucose POCT 67 (L) 70 - 100 mg/dL   Glucose Whole  Blood - POCT   Result Value Ref Range    Whole Blood Glucose POCT 190 (H) 70 - 100 mg/dL   Glucose Whole Blood - POCT   Result Value Ref Range    Whole Blood Glucose POCT 154 (H) 70 - 100 mg/dL         ATTESTATIONS      Physician Attestation: Di Kindle Jerzi Tigert, MD PhD , have been the primary provider for Elizabeth Dunlap during this Emergency Dept visit and have reviewed the chart documented for accuracy and agree with its content.       DIAGNOSIS      Diagnosis:  Final diagnoses:   Hypoglycemia   Hypothermia, initial encounter   AKI (acute kidney injury)       Disposition:  ED Disposition     ED Disposition   Discharge Boarder to Home    Condition   --    Date/Time   Tue Nov 22, 2020 12:28 PM    Comment   --             Prescriptions:  Patient's Medications   New Prescriptions    No medications on file   Previous Medications    ASPIRIN EC 81 MG EC TABLET    Take 81 mg by mouth    CANDESARTAN (ATACAND) 32 MG TABLET    Take 32 mg by mouth daily       CARVEDILOL (COREG) 25 MG TABLET    Take 25 mg by mouth    CHOLECALCIFEROL (VITAMIN D PO)    Take 5,000 Units by mouth every other day.       CLOPIDOGREL (PLAVIX) 75 MG TABLET    Take 75 mg by mouth daily    HYDROCHLOROTHIAZIDE (HYDRODIURIL) 25 MG TABLET    Take 25 mg by mouth daily    INSULIN GLARGINE 100 UNIT/ML INJECTION PEN    Inject into the skin nightly    INSULIN LISPRO (HUMALOG) 100 UNIT/ML INJECTION    Inject 16 Units into the skin 3 (three) times daily before meals.    IRON PO    Take 65 mg by mouth daily.       ROSUVASTATIN (CRESTOR) 40 MG TABLET    Take 40 mg by mouth daily   Modified Medications    No medications on file   Discontinued Medications    No medications on file              Atlas Kuc, Elizabeth Going, MD PhD  11/22/20 1231

## 2020-11-22 NOTE — Discharge Instructions (Signed)
2. 9 mm hazy nodular opacity in the right lower lobe. This could be   postinfectious or postinflammatory or even related to a component of   atelectasis. Suggest follow-up CT of the chest in 2-4 weeks.       Please follow up with PCP and your endocrine and renal specialists. If worsening symptoms, return to ED.

## 2020-11-22 NOTE — ED Notes (Signed)
BG 67. PT AAOX4. PT GIVEN APPLE JUICE AND GRAHAM CRACKERS. DR. HE AWARE.

## 2020-11-22 NOTE — ED Notes (Signed)
Dr. He requested warmed 1L normal saline adminstered

## 2020-11-22 NOTE — ED Notes (Signed)
Blood sugar 132

## 2020-11-23 ENCOUNTER — Emergency Department: Payer: BC Managed Care – PPO

## 2020-11-23 ENCOUNTER — Inpatient Hospital Stay
Admission: EM | Admit: 2020-11-23 | Discharge: 2020-11-25 | DRG: 683 | Disposition: A | Discharge status: Home / Self Care | Payer: BC Managed Care – PPO | Attending: Internal Medicine | Admitting: Internal Medicine

## 2020-11-23 DIAGNOSIS — E669 Obesity, unspecified: Secondary | ICD-10-CM | POA: Diagnosis present

## 2020-11-23 DIAGNOSIS — Z9884 Bariatric surgery status: Secondary | ICD-10-CM

## 2020-11-23 DIAGNOSIS — K219 Gastro-esophageal reflux disease without esophagitis: Secondary | ICD-10-CM | POA: Diagnosis present

## 2020-11-23 DIAGNOSIS — Z955 Presence of coronary angioplasty implant and graft: Secondary | ICD-10-CM

## 2020-11-23 DIAGNOSIS — Z7982 Long term (current) use of aspirin: Secondary | ICD-10-CM

## 2020-11-23 DIAGNOSIS — I255 Ischemic cardiomyopathy: Secondary | ICD-10-CM | POA: Diagnosis present

## 2020-11-23 DIAGNOSIS — N184 Chronic kidney disease, stage 4 (severe): Secondary | ICD-10-CM | POA: Diagnosis present

## 2020-11-23 DIAGNOSIS — I471 Supraventricular tachycardia: Secondary | ICD-10-CM | POA: Diagnosis not present

## 2020-11-23 DIAGNOSIS — I2510 Atherosclerotic heart disease of native coronary artery without angina pectoris: Secondary | ICD-10-CM | POA: Diagnosis present

## 2020-11-23 DIAGNOSIS — D638 Anemia in other chronic diseases classified elsewhere: Secondary | ICD-10-CM | POA: Diagnosis present

## 2020-11-23 DIAGNOSIS — Z7902 Long term (current) use of antithrombotics/antiplatelets: Secondary | ICD-10-CM

## 2020-11-23 DIAGNOSIS — R7881 Bacteremia: Secondary | ICD-10-CM

## 2020-11-23 DIAGNOSIS — E876 Hypokalemia: Secondary | ICD-10-CM | POA: Diagnosis present

## 2020-11-23 DIAGNOSIS — N179 Acute kidney failure, unspecified: Secondary | ICD-10-CM

## 2020-11-23 DIAGNOSIS — E785 Hyperlipidemia, unspecified: Secondary | ICD-10-CM | POA: Diagnosis present

## 2020-11-23 DIAGNOSIS — E1122 Type 2 diabetes mellitus with diabetic chronic kidney disease: Secondary | ICD-10-CM | POA: Diagnosis present

## 2020-11-23 DIAGNOSIS — I161 Hypertensive emergency: Secondary | ICD-10-CM | POA: Diagnosis present

## 2020-11-23 DIAGNOSIS — Z79899 Other long term (current) drug therapy: Secondary | ICD-10-CM

## 2020-11-23 DIAGNOSIS — Z794 Long term (current) use of insulin: Secondary | ICD-10-CM

## 2020-11-23 DIAGNOSIS — E119 Type 2 diabetes mellitus without complications: Secondary | ICD-10-CM | POA: Diagnosis present

## 2020-11-23 DIAGNOSIS — I129 Hypertensive chronic kidney disease with stage 1 through stage 4 chronic kidney disease, or unspecified chronic kidney disease: Secondary | ICD-10-CM | POA: Diagnosis present

## 2020-11-23 DIAGNOSIS — E875 Hyperkalemia: Secondary | ICD-10-CM | POA: Diagnosis present

## 2020-11-23 DIAGNOSIS — Z6832 Body mass index (BMI) 32.0-32.9, adult: Secondary | ICD-10-CM

## 2020-11-23 DIAGNOSIS — I160 Hypertensive urgency: Secondary | ICD-10-CM | POA: Diagnosis present

## 2020-11-23 DIAGNOSIS — Z880 Allergy status to penicillin: Secondary | ICD-10-CM

## 2020-11-23 DIAGNOSIS — R079 Chest pain, unspecified: Secondary | ICD-10-CM

## 2020-11-23 DIAGNOSIS — G9349 Other encephalopathy: Secondary | ICD-10-CM | POA: Diagnosis present

## 2020-11-23 DIAGNOSIS — E872 Acidosis: Secondary | ICD-10-CM | POA: Diagnosis present

## 2020-11-23 LAB — CBC AND DIFFERENTIAL
Absolute NRBC: 0 10*3/uL (ref 0.00–0.00)
Basophils Absolute Automated: 0.03 10*3/uL (ref 0.00–0.08)
Basophils Automated: 0.4 %
Eosinophils Absolute Automated: 0.09 10*3/uL (ref 0.00–0.44)
Eosinophils Automated: 1.2 %
Hematocrit: 26.7 % — ABNORMAL LOW (ref 34.7–43.7)
Hgb: 8.4 g/dL — ABNORMAL LOW (ref 11.4–14.8)
Immature Granulocytes Absolute: 0.02 10*3/uL (ref 0.00–0.07)
Immature Granulocytes: 0.3 %
Lymphocytes Absolute Automated: 2.16 10*3/uL (ref 0.42–3.22)
Lymphocytes Automated: 29.2 %
MCH: 28 pg (ref 25.1–33.5)
MCHC: 31.5 g/dL (ref 31.5–35.8)
MCV: 89 fL (ref 78.0–96.0)
MPV: 11.5 fL (ref 8.9–12.5)
Monocytes Absolute Automated: 0.35 10*3/uL (ref 0.21–0.85)
Monocytes: 4.7 %
Neutrophils Absolute: 4.74 10*3/uL (ref 1.10–6.33)
Neutrophils: 64.2 %
Nucleated RBC: 0 /100 WBC (ref 0.0–0.0)
Platelets: 178 10*3/uL (ref 142–346)
RBC: 3 10*6/uL — ABNORMAL LOW (ref 3.90–5.10)
RDW: 12 % (ref 11–15)
WBC: 7.39 10*3/uL (ref 3.10–9.50)

## 2020-11-23 LAB — COMPREHENSIVE METABOLIC PANEL
ALT: 18 U/L (ref 0–55)
AST (SGOT): 19 U/L (ref 5–34)
Albumin/Globulin Ratio: 1 (ref 0.9–2.2)
Albumin: 3.3 g/dL — ABNORMAL LOW (ref 3.5–5.0)
Alkaline Phosphatase: 62 U/L (ref 37–106)
Anion Gap: 9 (ref 5.0–15.0)
BUN: 52 mg/dL — ABNORMAL HIGH (ref 7.0–19.0)
Bilirubin, Total: 0.4 mg/dL (ref 0.2–1.2)
CO2: 18 mEq/L — ABNORMAL LOW (ref 22–29)
Calcium: 9.1 mg/dL (ref 8.5–10.5)
Chloride: 111 mEq/L (ref 100–111)
Creatinine: 3.6 mg/dL — ABNORMAL HIGH (ref 0.6–1.0)
Globulin: 3.3 g/dL (ref 2.0–3.6)
Glucose: 224 mg/dL — ABNORMAL HIGH (ref 70–100)
Potassium: 6.4 mEq/L (ref 3.5–5.1)
Protein, Total: 6.6 g/dL (ref 6.0–8.3)
Sodium: 138 mEq/L (ref 136–145)

## 2020-11-23 LAB — BASIC METABOLIC PANEL
Anion Gap: 10 (ref 5.0–15.0)
BUN: 46 mg/dL — ABNORMAL HIGH (ref 7.0–19.0)
CO2: 20 mEq/L — ABNORMAL LOW (ref 22–29)
Calcium: 9.1 mg/dL (ref 8.5–10.5)
Chloride: 111 mEq/L (ref 100–111)
Creatinine: 3.4 mg/dL — ABNORMAL HIGH (ref 0.6–1.0)
Glucose: 171 mg/dL — ABNORMAL HIGH (ref 70–100)
Potassium: 4.8 mEq/L (ref 3.5–5.1)
Sodium: 141 mEq/L (ref 136–145)

## 2020-11-23 LAB — PHOSPHORUS: Phosphorus: 4.6 mg/dL (ref 2.3–4.7)

## 2020-11-23 LAB — TROPONIN I
Troponin I: <0.01 ng/mL (ref 0.00–0.05)
Troponin I: <0.01 ng/mL (ref 0.00–0.05)

## 2020-11-23 LAB — HEMOLYSIS INDEX
Hemolysis Index: 7 (ref 0–18)
Hemolysis Index: 5 (ref 0–18)

## 2020-11-23 LAB — MAGNESIUM: Magnesium: 1.5 mg/dL — ABNORMAL LOW (ref 1.6–2.6)

## 2020-11-23 LAB — GLUCOSE WHOLE BLOOD - POCT
Whole Blood Glucose POCT: 205 mg/dL — ABNORMAL HIGH (ref 70–100)
Whole Blood Glucose POCT: 192 mg/dL — ABNORMAL HIGH (ref 70–100)
Whole Blood Glucose POCT: 141 mg/dL — ABNORMAL HIGH (ref 70–100)

## 2020-11-23 LAB — LACTIC ACID, PLASMA: Lactic Acid: 0.8 mmol/L (ref 0.2–2.0)

## 2020-11-23 LAB — GFR
EGFR: 16.1
EGFR: 17.2

## 2020-11-23 MED ORDER — INSULIN LISPRO 100 UNIT/ML SC SOLN
1.0000 [IU] | Freq: Every evening | SUBCUTANEOUS | Status: DC
Start: 2020-11-23 — End: 2020-11-25
  Administered 2020-11-24: 21:00:00 2 [IU] via SUBCUTANEOUS
  Administered 2020-11-24: 01:00:00 1 [IU] via SUBCUTANEOUS
  Filled 2020-11-23: qty 15
  Filled 2020-11-23: qty 3

## 2020-11-23 MED ORDER — ACETAMINOPHEN 325 MG PO TABS
650.0000 mg | ORAL_TABLET | Freq: Four times a day (QID) | ORAL | Status: AC | PRN
Start: 2020-11-23 — End: 2020-11-23

## 2020-11-23 MED ORDER — MAGNESIUM SULFATE IN D5W 1-5 GM/100ML-% IV SOLN
1.0000 g | INTRAVENOUS | Status: AC
Start: 2020-11-24 — End: 2020-11-24
  Administered 2020-11-24 (×2): 1 g via INTRAVENOUS
  Filled 2020-11-23: qty 100

## 2020-11-23 MED ORDER — LABETALOL HCL 5 MG/ML IV SOLN (WRAP)
20.0000 mg | Freq: Once | INTRAVENOUS | Status: DC | PRN
Start: 2020-11-23 — End: 2020-11-25

## 2020-11-23 MED ORDER — SODIUM BICARBONATE 8.4 % IV SOLN
50.0000 meq | Freq: Once | INTRAVENOUS | Status: AC
Start: 2020-11-23 — End: 2020-11-23
  Administered 2020-11-23: 17:00:00 50 meq via INTRAVENOUS
  Filled 2020-11-23: qty 50

## 2020-11-23 MED ORDER — SODIUM CHLORIDE 0.9 % IV MBP
500.0000 mg | Freq: Two times a day (BID) | INTRAVENOUS | Status: DC
Start: 2020-11-23 — End: 2020-11-24
  Administered 2020-11-23: 500 mg via INTRAVENOUS
  Filled 2020-11-23: qty 0.5

## 2020-11-23 MED ORDER — DEXTROSE 10 % IV BOLUS
0.0000 g | Freq: Once | INTRAVENOUS | Status: AC | PRN
Start: 2020-11-23 — End: 2020-11-23
  Administered 2020-11-23: 17:00:00 500 mL via INTRAVENOUS

## 2020-11-23 MED ORDER — ONDANSETRON HCL 4 MG/2ML IJ SOLN
4.0000 mg | INTRAMUSCULAR | Status: DC | PRN
Start: 2020-11-23 — End: 2020-11-23

## 2020-11-23 MED ORDER — DEXTROSE 5% IV BOLUS
500.0000 mL | Freq: Once | INTRAVENOUS | Status: DC | PRN
Start: 2020-11-23 — End: 2020-11-25

## 2020-11-23 MED ORDER — DEXTROSE 5% IV BOLUS
250.0000 mL | INTRAVENOUS | Status: DC | PRN
Start: 2020-11-23 — End: 2020-11-25

## 2020-11-23 MED ORDER — MELATONIN 3 MG PO TABS
3.0000 mg | ORAL_TABLET | Freq: Every evening | ORAL | Status: DC | PRN
Start: 2020-11-23 — End: 2020-11-25

## 2020-11-23 MED ORDER — DEXTROSE 10 % IV BOLUS
25.0000 g | INTRAVENOUS | Status: DC | PRN
Start: 2020-11-23 — End: 2020-11-25

## 2020-11-23 MED ORDER — VANCOMYCIN HCL 1 G IV SOLR
1750.0000 mg | Freq: Once | INTRAVENOUS | Status: AC
Start: 2020-11-23 — End: 2020-11-23
  Administered 2020-11-23: 18:00:00 1750 mg via INTRAVENOUS
  Filled 2020-11-23: qty 1750

## 2020-11-23 MED ORDER — CALCIUM GLUCONATE 10 % IV SOLN
1.0000 g | Freq: Once | INTRAVENOUS | Status: AC
Start: 2020-11-23 — End: 2020-11-23
  Administered 2020-11-23: 17:00:00 1 g via INTRAVENOUS
  Filled 2020-11-23: qty 10

## 2020-11-23 MED ORDER — ACETAMINOPHEN 650 MG RE SUPP
650.0000 mg | Freq: Four times a day (QID) | RECTAL | Status: DC | PRN
Start: 2020-11-23 — End: 2020-11-25

## 2020-11-23 MED ORDER — ONDANSETRON HCL 4 MG/2ML IJ SOLN
4.0000 mg | Freq: Four times a day (QID) | INTRAMUSCULAR | Status: DC | PRN
Start: 2020-11-23 — End: 2020-11-25

## 2020-11-23 MED ORDER — HEPARIN SODIUM (PORCINE) 5000 UNIT/ML IJ SOLN
5000.0000 [IU] | Freq: Two times a day (BID) | INTRAMUSCULAR | Status: DC
Start: 2020-11-23 — End: 2020-11-25
  Administered 2020-11-23 – 2020-11-24 (×2): 5000 [IU] via SUBCUTANEOUS
  Filled 2020-11-23 (×4): qty 1

## 2020-11-23 MED ORDER — AZITHROMYCIN 250 MG PO TABS
500.0000 mg | ORAL_TABLET | Freq: Once | ORAL | Status: AC
Start: 2020-11-23 — End: 2020-11-23
  Administered 2020-11-23: 18:00:00 500 mg via ORAL
  Filled 2020-11-23: qty 2

## 2020-11-23 MED ORDER — DEXTROSE 50 % IV SOLN
25.0000 g | INTRAVENOUS | Status: DC | PRN
Start: 2020-11-23 — End: 2020-11-23

## 2020-11-23 MED ORDER — SODIUM CHLORIDE 0.9 % IV SOLN
INTRAVENOUS | Status: DC
Start: 2020-11-23 — End: 2020-11-24

## 2020-11-23 MED ORDER — ASPIRIN 81 MG PO TBEC
81.0000 mg | DELAYED_RELEASE_TABLET | Freq: Every day | ORAL | Status: DC
Start: 2020-11-24 — End: 2020-11-25
  Administered 2020-11-24 – 2020-11-25 (×2): 81 mg via ORAL
  Filled 2020-11-23 (×2): qty 1

## 2020-11-23 MED ORDER — ACETAMINOPHEN 325 MG PO TABS
650.0000 mg | ORAL_TABLET | Freq: Four times a day (QID) | ORAL | Status: DC | PRN
Start: 2020-11-23 — End: 2020-11-25

## 2020-11-23 MED ORDER — DEXTROSE 10 % IV BOLUS
25.0000 g | INTRAVENOUS | Status: DC | PRN
Start: 2020-11-23 — End: 2020-11-23

## 2020-11-23 MED ORDER — ONDANSETRON 4 MG PO TBDP
4.0000 mg | ORAL_TABLET | ORAL | Status: DC | PRN
Start: 2020-11-23 — End: 2020-11-23

## 2020-11-23 MED ORDER — SODIUM POLYSTYRENE SULFONATE 15 GM/60ML PO SUSP
30.0000 g | Freq: Once | ORAL | Status: AC
Start: 2020-11-23 — End: 2020-11-23
  Administered 2020-11-23: 17:00:00 30 g via ORAL
  Filled 2020-11-23: qty 120

## 2020-11-23 MED ORDER — DEXTROSE 50 % IV SOLN
0.0000 g | Freq: Once | INTRAVENOUS | Status: DC | PRN
Start: 2020-11-23 — End: 2020-11-25

## 2020-11-23 MED ORDER — VANCOMYCIN PHARMACY TO DOSE PLACEHOLDER
INTRAVENOUS | Status: DC
Start: 2020-11-23 — End: 2020-11-24

## 2020-11-23 MED ORDER — ONDANSETRON 4 MG PO TBDP
4.0000 mg | ORAL_TABLET | Freq: Four times a day (QID) | ORAL | Status: DC | PRN
Start: 2020-11-23 — End: 2020-11-25

## 2020-11-23 MED ORDER — DEXTROSE 50 % IV SOLN
50.0000 mL | Freq: Once | INTRAVENOUS | Status: DC | PRN
Start: 2020-11-23 — End: 2020-11-25
  Filled 2020-11-23: qty 50

## 2020-11-23 MED ORDER — INSULIN SYRINGES (DISPOSABLE) U-100 1 ML MISC
5.0000 [IU] | Freq: Once | Status: AC
Start: 2020-11-23 — End: 2020-11-23
  Administered 2020-11-23: 18:00:00 5 [IU] via INTRAVENOUS
  Filled 2020-11-23: qty 15

## 2020-11-23 MED ORDER — NALOXONE HCL 0.4 MG/ML IJ SOLN (WRAP)
0.2000 mg | INTRAMUSCULAR | Status: DC | PRN
Start: 2020-11-23 — End: 2020-11-25

## 2020-11-23 MED ORDER — AMLODIPINE BESYLATE 5 MG PO TABS
10.0000 mg | ORAL_TABLET | Freq: Once | ORAL | Status: AC
Start: 2020-11-23 — End: 2020-11-23
  Administered 2020-11-23: 18:00:00 10 mg via ORAL
  Filled 2020-11-23: qty 2

## 2020-11-23 MED ORDER — DEXTROSE 5% IV BOLUS
250.0000 mL | INTRAVENOUS | Status: DC | PRN
Start: 2020-11-23 — End: 2020-11-23

## 2020-11-23 MED ORDER — INSULIN LISPRO 100 UNIT/ML SC SOLN
1.0000 [IU] | Freq: Three times a day (TID) | SUBCUTANEOUS | Status: DC
Start: 2020-11-24 — End: 2020-11-25
  Administered 2020-11-24: 18:00:00 2 [IU] via SUBCUTANEOUS
  Filled 2020-11-23: qty 6

## 2020-11-23 MED ORDER — ROSUVASTATIN CALCIUM 10 MG PO TABS
10.0000 mg | ORAL_TABLET | Freq: Every day | ORAL | Status: DC
Start: 2020-11-24 — End: 2020-11-25
  Administered 2020-11-24 – 2020-11-25 (×2): 10 mg via ORAL
  Filled 2020-11-23 (×2): qty 1

## 2020-11-23 MED ORDER — GLUCAGON 1 MG IJ SOLR (WRAP)
1.0000 mg | INTRAMUSCULAR | Status: DC | PRN
Start: 2020-11-23 — End: 2020-11-25

## 2020-11-23 MED ORDER — GLUCAGON 1 MG IJ SOLR (WRAP)
1.0000 mg | INTRAMUSCULAR | Status: DC | PRN
Start: 2020-11-23 — End: 2020-11-23

## 2020-11-23 MED ORDER — SODIUM BICARBONATE 650 MG PO TABS
650.0000 mg | ORAL_TABLET | Freq: Four times a day (QID) | ORAL | Status: DC
Start: 2020-11-23 — End: 2020-11-24
  Administered 2020-11-23 – 2020-11-24 (×2): 650 mg via ORAL
  Filled 2020-11-23 (×2): qty 1

## 2020-11-23 MED ORDER — SODIUM CHLORIDE 0.9 % IV BOLUS
1000.0000 mL | Freq: Once | INTRAVENOUS | Status: AC
Start: 2020-11-23 — End: 2020-11-23
  Administered 2020-11-23: 16:00:00 1000 mL via INTRAVENOUS

## 2020-11-23 MED ORDER — DEXTROSE 50 % IV SOLN
25.0000 g | INTRAVENOUS | Status: DC | PRN
Start: 2020-11-23 — End: 2020-11-25

## 2020-11-23 MED ORDER — CARVEDILOL 25 MG PO TABS
25.0000 mg | ORAL_TABLET | Freq: Two times a day (BID) | ORAL | Status: DC
Start: 2020-11-23 — End: 2020-11-25
  Administered 2020-11-23 – 2020-11-25 (×4): 25 mg via ORAL
  Filled 2020-11-23 (×4): qty 1

## 2020-11-23 NOTE — H&P (Addendum)
[facility]      Patient: Elizabeth Dunlap  Date: 11/23/2020   DOB: 1971/06/11  Admission Date: 11/23/2020   MRN: [id number]  Attending: Rebecka Apley, MD  Please contact me on Epic secure chat        Chief Complaint   Patient presents with   . Abnormal Lab       Source of History: History is provided by the patient and chart review.     HISTORY AND PHYSICAL     Elizabeth Dunlap is a 50 y.o. female with history of coronary artery disease status post stenting in 2019, ischemic cardiomyopathy bariatric surgery in 2020, chronic kidney disease type 2, diabetes mellitus, hypertension, hyperlipidemia, anemia, GERD who was advised to return to the ED for abnormal lab results. She was seen at the [facility] yesterday for presyncope/weakness due to hypoglycemia and chills.  She reports she took glipizide 2.5 and taking insulin 12 units novolog the night prior when her BG > 300. She was asked to return to [facility] today after 1 of 2 sets of blood cultures showed gram-positive cocci.  Patient has no complaints today.  She denies fever, cough, chest pain, shortness of breath, abdominal pain, nausea, vomiting, dysuria, hematuria.  Denies headache, dizziness, orthopnea, leg swelling, hematochezia, melena, hematuria, numbness/tingling, focal weakness.   In the ED, patient's blood pressure was 190/91, afebrile, not tachycardic, not hypoxic.  Labs pertinent for elevated creatinine at 3.6 and potassium of 6.4.  She received the hyperkalemia treatment including insulin, D50, Kayexalate.  She also received vancomycin and azithromycin.    Past Medical History:  Past Medical History:   Diagnosis Date   . Anemia    . Asthma without status asthmaticus     childhood   . Chronic kidney disease    . Diabetes mellitus type II, controlled    . DKA (diabetic ketoacidoses)    . GERD (gastroesophageal reflux disease)     occ.   . Vision abnormalities        Past Surgical History:  Past Surgical History:   Procedure Laterality Date   . CESAREAN SECTION      . CORONARY ANGIOPLASTY WITH STENT PLACEMENT     . HYSTEROSCOPY, ENDOMETRIAL ABLATION, THERMACHOICE  10/08/2012    Procedure: HYSTEROSCOPY, ENDOMETRIAL ABLATION, THERMACHOICE;  Surgeon: Larae Grooms, MD;  Location: [facility];  Service: Gynecology;  Laterality: N/A;  thermachoice       Medications:   Prior to Admission medications    Medication Sig Start Date End Date Taking? Authorizing Provider   aspirin EC 81 MG EC tablet Take 81 mg by mouth 12/23/14  Yes [provider]   carvedilol (COREG) 25 MG tablet Take 25 mg by mouth   Yes [provider]   Cholecalciferol (VITAMIN D PO) Take 5,000 Units by mouth every other day.      Yes [provider]   clopidogrel (PLAVIX) 75 mg tablet Take 75 mg by mouth daily   Yes [provider]   hydroCHLOROthiazide (HYDRODIURIL) 25 MG tablet Take 25 mg by mouth daily   Yes [provider]   insulin glargine 100 UNIT/ML injection pen Inject into the skin nightly   Yes [provider]   insulin lispro (HUMALOG) 100 UNIT/ML injection Inject 16 Units into the skin 3 (three) times daily before meals.   Yes [provider]   IRON PO Take 65 mg by mouth daily.      Yes [provider]  rosuvastatin (CRESTOR) 40 MG tablet Take 40 mg by mouth daily   Yes [provider]   candesartan (ATACAND) 32 MG tablet Take 32 mg by mouth daily       [provider]       Allergies:   Allergies   Allergen Reactions   . Penicillins Other (See Comments)     UNKNOWN   . Ace Inhibitors      cough       Family History:   No family history on file.    Social History:   Social History     Socioeconomic History   . Marital status: Single   Tobacco Use   . Smoking status: Never Smoker   . Smokeless tobacco: Never Used   Vaping Use   . Vaping Use: Never used   Substance and Sexual Activity   . Alcohol use: No   . Drug use: No             REVIEW OF SYSTEMS   ROS: Pertinent positives and negatives as noted in the  HPI. All other systems were reviewed and are negative.     PHYSICAL EXAM      Vital Signs (most recent): BP (!) 190/91   Pulse 78   Temp 99 F (37.2 C) (Oral)   Resp 17   Ht 1.651 m ('5\' 5"'$ )   Wt 87.6 kg (193 lb 3.2 oz)   SpO2 100%   BMI 32.15 kg/m   Constiutional: NAD. Pleasant, alert and cooperative. Nontoxic appearing.   HEENT: NCAT, PERRL, conjunctivae/corneas clear. No scleral icterus. .   Neck: Supple, no meningismus  Cardiovascular: RRR, normal S1 S2, no murmurs  Respiratory: Normal rate. CTAB, unlabored respiratory effort, no wheezes, crackles.   Gastrointestinal: +BS, soft, non-tender, non-distended. No rebound, rigidity or guarding.   Genitourinary: no suprapubic or costovertebral angle tenderness  Musculoskeletal: ROM and motor strength grossly normal. No joint tenderness, deformity or swelling  Skin: no rashes, jaundice or other lesions. normal coloration and turgor  Extremities: atraumatic, no edema. Pulses 2+. Capillary refill < 3s  Neurologic: AAOx3, CN 2-12 intact. No focal deficits noted. Strength and sensation equal and intact. Appropriately answering questions and following commands.   Patient speaks freely in full sentences. Normal speech.   Psychiatric: affect and mood appropriate. The patient is alert, interactive, appropriate.    LABS & IMAGING     Recent Results (from the past 24 hour(s))   Glucose Whole Blood - POCT    Collection Time: 11/23/20  2:40 PM   Result Value Ref Range    Whole Blood Glucose POCT 205 (H) 70 - 100 mg/dL   CBC and differential    Collection Time: 11/23/20  3:23 PM   Result Value Ref Range    WBC 7.39 3.10 - 9.50 x10 3/uL    Hgb 8.4 (L) 11.4 - 14.8 g/dL    Hematocrit 26.7 (L) 34.7 - 43.7 %    Platelets 178 142 - 346 x10 3/uL    RBC 3.00 (L) 3.90 - 5.10 x10 6/uL    MCV 89.0 78.0 - 96.0 fL    MCH 28.0 25.1 - 33.5 pg    MCHC 31.5 31.5 - 35.8 g/dL    RDW 12 11 - 15 %    MPV 11.5 8.9 - 12.5 fL    Neutrophils 64.2 None %    Lymphocytes Automated 29.2 None %     Monocytes 4.7 None %    Eosinophils Automated 1.2  None %    Basophils Automated 0.4 None %    Immature Granulocytes 0.3 None %    Nucleated RBC 0.0 0.0 - 0.0 /100 WBC    Neutrophils Absolute 4.74 1.10 - 6.33 x10 3/uL    Lymphocytes Absolute Automated 2.16 0.42 - 3.22 x10 3/uL    Monocytes Absolute Automated 0.35 0.21 - 0.85 x10 3/uL    Eosinophils Absolute Automated 0.09 0.00 - 0.44 x10 3/uL    Basophils Absolute Automated 0.03 0.00 - 0.08 x10 3/uL    Immature Granulocytes Absolute 0.02 0.00 - 0.07 x10 3/uL    Absolute NRBC 0.00 0.00 - 0.00 x10 3/uL   Comprehensive metabolic panel    Collection Time: 11/23/20  3:23 PM   Result Value Ref Range    Glucose 224 (H) 70 - 100 mg/dL    BUN 52.0 (H) 7.0 - 19.0 mg/dL    Creatinine 3.6 (H) 0.6 - 1.0 mg/dL    Sodium 138 136 - 145 mEq/L    Potassium 6.4 (HH) 3.5 - 5.1 mEq/L    Chloride 111 100 - 111 mEq/L    CO2 18 (L) 22 - 29 mEq/L    Calcium 9.1 8.5 - 10.5 mg/dL    Protein, Total 6.6 6.0 - 8.3 g/dL    Albumin 3.3 (L) 3.5 - 5.0 g/dL    AST (SGOT) 19 5 - 34 U/L    ALT 18 0 - 55 U/L    Alkaline Phosphatase 62 37 - 106 U/L    Bilirubin, Total 0.4 0.2 - 1.2 mg/dL    Globulin 3.3 2.0 - 3.6 g/dL    Albumin/Globulin Ratio 1.0 0.9 - 2.2    Anion Gap 9.0 5.0 - 15.0   Lactic Acid    Collection Time: 11/23/20  3:23 PM   Result Value Ref Range    Lactic Acid 0.8 0.2 - 2.0 mmol/L   Hemolysis index    Collection Time: 11/23/20  3:23 PM   Result Value Ref Range    Hemolysis Index 7 0 - 18   GFR    Collection Time: 11/23/20  3:23 PM   Result Value Ref Range    EGFR 16.1    Troponin I    Collection Time: 11/23/20  3:23 PM   Result Value Ref Range    Troponin I <0.01 0.00 - 0.05 ng/mL   Glucose Whole Blood - POCT    Collection Time: 11/23/20  4:34 PM   Result Value Ref Range    Whole Blood Glucose POCT 192 (H) 70 - 100 mg/dL       Cultures  Blood Culture:In process    Antibiotics   Vanc, azithromycin    IMAGING:   CXR    IMPRESSION:     Ill-defined opacities at the right lower lung,  suspicious for pneumonia.  Consider follow-up imaging in 4 weeks to confirm resolution.       EKG Interpretation (upon my review):  NSR, HR 79, normal intervals, no acute ST-T changes per my read    Markers:  Recent Labs   Lab 11/23/20  1523 11/22/20  0754   Troponin I <0.01 <0.01       EMERGENCY DEPARTMENT COURSE:  Orders Placed This Encounter   Procedures   . Culture Blood Aerobic and Anaerobic   . Culture Blood Aerobic and Anaerobic   . XR Chest  AP Portable   . CBC and differential   . Comprehensive metabolic panel   . Urinalysis Reflex to Microscopic  Exam- Reflex to Culture   . Lactic Acid   . Hemolysis index   . GFR   . Troponin I   . Glucose POC   . Oral Glucose for BG 250 mg/dL or less   . NSG Communication: Glucose POCT order   . glucose POC every 1 hours x 4 hours   . ED [employer]   . Glucose Whole Blood - POCT   . Glucose Whole Blood - POCT   . ECG 12 lead   . Saline lock IV       ASSESSMENT & PLAN     Elizabeth Dunlap is a 50 y.o. female with history of coronary artery disease status post stenting in 2019, ischemic cardiomyopathy bariatric surgery in 2020, chronic kidney disease type 2, diabetes mellitus, hypertension, hyperlipidemia, anemia, GERD who was advised to return to the ED for abnormal lab results. She was seen at the [facility] yesterday for presyncope/weakness due to hypoglycemia and chills.  She reports she took glipizide 2.5 and taking insulin 12 units novolog the night prior when her BG > 300. She was asked to return to [facility] today after 1 of 2 sets of blood cultures showed gram-positive cocci.     The appropriate admission status for this patient is inpatient. Inpatient status is judged to be reasonable and necessary in order to provide the required intensity of service to ensure the patient's safety. The patient's presenting symptoms, physical exam findings, and initial radiographic and laboratory data in the context of their chronic comorbidities is felt to place them at  high risk for further clinical deterioration. Furthermore, it is not anticipated that the patient will be medically stable for discharge from the hospital within 2 midnights of admission. The following factors support the admission status of inpatient. This is due to the patient's presenting symptoms of chills, worrisome laboratory data of creatinine of 3.6 and K of 6.6 and significant comorbidities including hypertension, diabetes, ischemic cardiomyopathy, CAD status post stents, stage IV CKD.    Patient Active Hospital Problem List:    Acute renal failure  Stage IV CKD  Hyperkalemia, K 6.6 s/p kayexalate, [id number]  Metabolic acidosis  -It appears from lab review under care everywhere that patients Cr is ~ 2.2  -Followed by Dr. Vista Lawman at [place] [facility]  -Avoid nephrotoxins and hold home ARB  -IVF hydration, NS'@100'$ /hr  -Start sodium bicarbonate  -Monitor renal function, lytes, UOP  -If renal function worsens, please call nephrology    Gram Positive Cocci bacteremia  Probable right lower lung pneumonia  -Afebrile, hemodynamically stable, no leukocytosis  -Check procalcitonin  -Repeat blood cultures sent   -Continue vancomycin, start merrem (PCN allergy)  -Repeat 2 view CXR in AM  -Obtain echocardiogram in light of gram positive bacteremia  -ID, Dr. Gustavus Messing consulted    Anemia of chronic disease  -Likely due to CKD  -Hgb in the 8 range  -Under care everywhere, labs show Hgb in the 9 range in 2020  -Monitor Hgb    Hypertensive urgency  -SBP 190s  -Resume home coreg  -Hold ARB due to worsening renal function    Type 2 DM  -Hold oral hypoglycemic agents  -Check A1c  -AC/HS acuchecks and low dose SSI    Hyperlipidemia - continue home crestor    CAD s/p stent in 2019  History of ischemic CM  -Sees [facility]  -Con tine home ASA, crestor, coreg  -Holding ARB    History of bariatric surgery  Nutrition  Renal, carb controlled    OTHER  Code Status: FULL  DVT prophylaxis:  Heparin         I certify that at  the point of admission it is my clinical judgment that the patient will require inpatient hospital care spanning beyond 2 midnights from the point of admission due to high intensity of service, high risk for further deterioration and high frequency of surveillance required.    Signed,  Rebecka Apley, MD    11/23/2020 6:10 PM

## 2020-11-23 NOTE — Plan of Care (Signed)
Alert and oriented x4. Denies sob,chest pain,dizziness or headache. K+ with in expected ranges. Mag 1.5 provider notified,  IV mag given. IV fluids running. Troponin negative.  Pham confirmed. Hourly rounded. Pt free from fall. Call bell and belongings with in reach.  Problem: Fluid and Electrolyte Imbalance/ Endocrine  Goal: Fluid and electrolyte balance are achieved/maintained  Outcome: Progressing  Flowsheets (Taken 11/23/2020 2316)  Fluid and electrolyte balance are achieved/maintained:  . Monitor/assess lab values and report abnormal values  . Provide adequate hydration  . Observe for seizure activity and initiate seizure precautions if indicated  . Observe for cardiac arrhythmias  . Monitor for muscle weakness  . Assess and reassess fluid and electrolyte status     Problem: Diabetes: Glucose Imbalance  Goal: Blood glucose stable at established goal  Outcome: Progressing  Flowsheets (Taken 11/23/2020 2316)  Blood glucose stable at established goal:  . Monitor lab values  . Monitor/assess vital signs     Problem: Safety  Goal: Patient will be free from injury during hospitalization  Outcome: Progressing  Flowsheets (Taken 11/23/2020 2316)  Patient will be free from injury during hospitalization:  . Assess patient's risk for falls and implement fall prevention plan of care per policy  . Provide and maintain safe environment  . Hourly rounding  . Ensure appropriate safety devices are available at the bedside

## 2020-11-23 NOTE — ED Triage Notes (Signed)
Elizabeth Dunlap is a 50 y.o. female presents to the ED c/o abnormal blood cultures from [facility] yesterday. Pt reports dizziness yesterday but has since subsided. Hx of diabetes but pt reports dexi within normal limits today. VSS at this time

## 2020-11-23 NOTE — ED Notes (Signed)
Pt called at given number to inform that her Blood Culture from yesterday is positive. Pt informed to go to [facility] immediately and that she probably will be admitted. Pt verbalized understand and said that she would be getting ready to go.

## 2020-11-23 NOTE — ED Notes (Signed)
Bed: [id number]  Expected date:   Expected time:   Means of arrival:   Comments:  CN hold - 2 RNs

## 2020-11-23 NOTE — Progress Notes (Signed)
Initial Pharmacy Vancomycin Dosing Consult:  Day 1  Vancomycin Indication: Bacteremia/PNA  Vancomycin Target Trough Level: 15-20 mcg/mL    Pertinent Imaging:   4/13 CXR: Ill-defined opacities at the right lower lung, suspicious for pneumonia.  Consider follow-up imaging in 4 weeks to confirm resolution.    Pertinent Cultures:    Date Source  Organism & Pertinent Susceptibilities     4/12 Blood cx x2 NG x 1 day (1/2)  GPC (1/2)   4/13 Blood cx x2 (repeated) Pending              As appropriate, contact physician to consider change in therapy if cultures grow organism other than MRSA     Serum creatinine: 3.6 mg/dL (H) 11/23/20 1523  Estimated creatinine clearance: 20.2 mL/min (A)    Baseline SCr: ~2.3-2.4  Nephrotoxic drugs administered in the past 48 hours: None    Assessment  49 YOF who presented to the hospital with GPC in the blood and empirically started on vancomycin.      Recommendations:   Initial loading dose (~20 mg/kg) = 1750 mg IV x once on 4/13 @ 1811.   Maintenance regimen = Due to decreased renal function, will dose per level.   Vancomycin level: 24-hr random level ordered on 4/14 @ 1800.     Thank you,  Johann Capers, [facility]  Phone: [phone]

## 2020-11-23 NOTE — Consults (Signed)
Full consult to follow    Elizabeth Beverley A Jafari Mckillop, MD

## 2020-11-23 NOTE — ED Provider Notes (Signed)
EMERGENCY DEPARTMENT HISTORY AND PHYSICAL EXAM    Date: 11/23/2020  Patient Name: Elizabeth Dunlap  Attending Physician:  Kaleen Odea, MD  Diagnosis and Treatment Plan     Clinical Impression:   1. Hyperkalemia    2. Positive blood culture    3. AKI (acute kidney injury) with chronic kidney disease    4. Type 2 diabetes mellitus without complication, with long-term current use of insulin      Treatment Plan:   ED Disposition     ED Disposition   Admit    Condition   --    Date/Time   Wed Nov 23, 2020  6:19 PM    Comment   Admitting Physician: Rebecka Apley [id number]   Service:: Medicine [[id number]]   Estimated Length of Stay: > or = to 2 midnights   Tentative Discharge Plan?: Home or Self Care [1]   Does patient need telemetry?: Yes   Telemetry type (separate Telemetry order is also required):: Cardiac telemetry               History of Presenting Illness     Chief Complaint   Patient presents with    Abnormal Lab       History Provided By: patient and medical records  Chief Complaint: +blood culture    Additional History: Elizabeth Dunlap is a 50 y.o. female.  She was seen at [facility] yesterday for hypoglycemia/not feeling well/diarrhea. Had imaging and labs including blood cultures. Able to maintain bsl and go home. States overnight she was fine. Eating well. Normal diabetes care. No abd pain. No vomiting. No fevers. No dizziness. Normal urination. No leg pain/swelling. No back pains. No bloody diarrhea. No rashes. Called by [facility] for positive blood culture and directed to ed. She has DM and notes she hasn't been taking good care of herself recently due to the fact that she has been caring for other family members and yesterday she was noted to have elevated kidney function tests also. covid and flu neg yesterday.    PCP: Pcp, None, MD  [facility]: Karena Addison    Current Facility-Administered Medications:     0.9% NaCl infusion, , Intravenous, Continuous, Naseem, Rabiah, MD    acetaminophen (TYLENOL) tablet 650 mg,  650 mg, Oral, Q6H PRN **OR** acetaminophen (TYLENOL) suppository 650 mg, 650 mg, Rectal, Q6H PRN, Ann Maki, Rabiah, MD    [START ON 11/24/2020] aspirin EC tablet 81 mg, 81 mg, Oral, Daily, Naseem, Rabiah, MD    carvedilol (COREG) tablet 25 mg, 25 mg, Oral, Q12H [place], Naseem, Rabiah, MD    Nursing communication: Adult Hypoglycemia Treatment Algorithm, , , Until Discontinued **AND** glucagon (rDNA) (GLUCAGEN) injection 1 mg, 1 mg, Intramuscular, PRN **AND** dextrose 5 % bolus 250 mL, 250 mL, Intravenous, PRN **AND** dextrose 50 % bolus 25 g, 25 g, Intravenous, PRN **AND** dextrose (D10W) 10% bolus 250 mL, 25 g, Intravenous, PRN, Naseem, Rabiah, MD    [COMPLETED] insulin regular (HumuLIN R) 5 Units intravenous injection, 5 Units, Intravenous, Once, 5 Units at 11/23/20 1809 **AND** Oral Glucose for BG 250 mg/dL or less, , , Once **AND** dextrose 5 % bolus 500 mL, 500 mL, Intravenous, Once PRN **AND** [COMPLETED] dextrose (D10W) 10% bolus 0-500 mL, 0-50 g, Intravenous, Once PRN, Stopped at 11/23/20 1755 **AND** dextrose 50 % bolus 0-50 g, 0-50 g, Intravenous, Once PRN **AND** [EXPIRED] NSG Communication: Glucose POCT order, , , Q1H, Roena Malady, MD    dextrose 50 % bolus 50 mL, 50 mL, Intravenous,  Once PRN, Naseem, Rabiah, MD    heparin (porcine) injection 5,000 Units, 5,000 Units, Subcutaneous, Q12H [place], Naseem, Rabiah, MD    insulin lispro (HumaLOG) injection 1-3 Units, 1-3 Units, Subcutaneous, QHS **AND** [START ON 11/24/2020] NSG Communication: Glucose POCT order, , , At bedtime, Naseem, Rabiah, MD    [START ON 11/24/2020] insulin lispro (HumaLOG) injection 1-5 Units, 1-5 Units, Subcutaneous, TID AC **AND** [START ON 11/24/2020] NSG Communication: Glucose POCT order, , , AC, Naseem, Rabiah, MD    labetalol (NORMODYNE,TRANDATE) injection 20 mg, 20 mg, Intravenous, Once PRN, Naseem, Rabiah, MD    [START ON 11/24/2020] magnesium sulfate 1g in dextrose 5% 149m IVPB (premix), 1 g, Intravenous, Q1H, Naseem,  Rabiah, MD    melatonin tablet 3 mg, 3 mg, Oral, QHS PRN, Naseem, Rabiah, MD    meropenem (MERREM) 500 mg in sodium chloride 0.9 % 100 mL IVPB mini-bag plus, 500 mg, Intravenous, Q12H, Naseem, Rabiah, MD    naloxone (NARCAN) injection 0.2 mg, 0.2 mg, Intravenous, PRN, Naseem, Rabiah, MD    ondansetron (ZOFRAN-ODT) disintegrating tablet 4 mg, 4 mg, Oral, Q6H PRN **OR** ondansetron (ZOFRAN) injection 4 mg, 4 mg, Intravenous, Q6H PRN, Naseem, Rabiah, MD    [START ON 11/24/2020] rosuvastatin (CRESTOR) tablet 10 mg, 10 mg, Oral, Daily, Naseem, Rabiah, MD    sodium bicarbonate tablet 650 mg, 650 mg, Oral, QID, Naseem, Rabiah, MD    vancomycin (VANCOCIN) therapy dosed by pharmacy, , Intravenous, See Admin Instructions, NRebecka Apley MD    Past Medical History     Past Medical History:   Diagnosis Date    Anemia     Asthma without status asthmaticus     childhood    Chronic kidney disease     Diabetes mellitus type II, controlled     DKA (diabetic ketoacidoses)     GERD (gastroesophageal reflux disease)     occ.    Vision abnormalities      Past Surgical History:   Procedure Laterality Date    CESAREAN SECTION      CORONARY ANGIOPLASTY WITH STENT PLACEMENT      HYSTEROSCOPY, ENDOMETRIAL ABLATION, THERMACHOICE  10/08/2012    Procedure: HYSTEROSCOPY, ENDOMETRIAL ABLATION, THERMACHOICE;  Surgeon: ALarae Grooms MD;  Location: [facility];  Service: Gynecology;  Laterality: N/A;  thermachoice       Family History     No family history on file.    Social History     Social History     Socioeconomic History    Marital status: Single     Spouse name: Not on file    Number of children: Not on file    Years of education: Not on file    Highest education level: Not on file   Occupational History    Not on file   Tobacco Use    Smoking status: Never Smoker    Smokeless tobacco: Never Used   Vaping Use    Vaping Use: Never used   Substance and Sexual Activity    Alcohol use: No    Drug use: No    Sexual  activity: Not on file   Other Topics Concern    Not on file   Social History Narrative    Not on file     Social Determinants of Health     Financial Resource Strain: Not on file   Food Insecurity: Not on file   Transportation Needs: Not on file   Physical Activity: Not on file   Stress: Not  on file   Social Connections: Not on file   Intimate Partner Violence: Not on file   Housing Stability: Not on file       Allergies     Allergies   Allergen Reactions    Penicillins Other (See Comments)     UNKNOWN    Ace Inhibitors      cough       Review of Systems     Review of Systems   Constitutional: Negative for chills and fever.   HENT: Negative for congestion and sore throat.    Respiratory: Negative for cough, sputum production and shortness of breath.    Cardiovascular: Negative for chest pain, palpitations and leg swelling.   Gastrointestinal: Positive for diarrhea. Negative for abdominal pain, blood in stool, nausea and vomiting.   Genitourinary: Negative.    Skin: Negative for rash.   Neurological: Negative for dizziness.   Patient asked and all other systems reviewed and are negative for acute complaints/concerns.    Physical Exam     BP (!) 176/92    Pulse 80    Temp 98.2 F (36.8 C) (Oral)    Resp 17    Ht '5\' 5"'$  (1.651 m)    Wt 87.6 kg    SpO2 100%    BMI 32.15 kg/m   Pulse Oximetry Analysis - Normal 100% On RA    Physical Exam   Constitutional: She is oriented to person, place, and time. She appears well-developed and well-nourished.   Head: Normocephalic and atraumatic.   Eyes: No scleral icterus.   Neck: Normal range of motion. Neck supple.   Cardiovascular: Intact distal pulses.    Pulmonary/Chest: Effort normal. No respiratory distress.   Neurological: She is alert and oriented to person, place, and time.   Skin: Skin is warm and dry.   Psychiatric: She has a normal mood and affect. Her behavior is normal. Judgment and thought content normal.   abd soft and non-tender. Lungs ctab. No pedal edema. No  rash on visible skin    Diagnostic Study Results     Labs -     Results     Procedure Component Value Units Date/Time    Basic Metabolic Panel [id number]  (Abnormal) Collected: 11/23/20 2159    Specimen: Blood Updated: 11/23/20 2255     Glucose 171 mg/dL      BUN 46.0 mg/dL      Creatinine 3.4 mg/dL      Calcium 9.1 mg/dL      Sodium 141 mEq/L      Potassium 4.8 mEq/L      Chloride 111 mEq/L      CO2 20 mEq/L      Anion Gap 10.0    Magnesium [id number]  (Abnormal) Collected: 11/23/20 2159    Specimen: Blood Updated: 11/23/20 2255     Magnesium 1.5 mg/dL     Hemolysis index [id number] Collected: 11/23/20 2159     Updated: 11/23/20 2255     Hemolysis Index 5    GFR [id number] Collected: 11/23/20 2159     Updated: 11/23/20 2255     EGFR 17.2    Phosphorus [id number] Collected: 11/23/20 2159    Specimen: Blood Updated: 11/23/20 2255     Phosphorus 4.6 mg/dL     Procalcitonin [id number] Collected: 11/23/20 2139     Updated: 11/23/20 2223    Narrative:      Remove serum from gel or clot before transporting.    Troponin I [id number] Collected: 11/23/20  2139    Specimen: Blood Updated: 11/23/20 2216     Troponin I <0.01 ng/mL     Glucose Whole Blood - POCT [id number]  (Abnormal) Collected: 11/23/20 2108     Updated: 11/23/20 2118     Whole Blood Glucose POCT 141 mg/dL     Culture Blood Aerobic and Anaerobic [id number] Collected: 11/23/20 1523    Specimen: Arm from Blood, Venipuncture Updated: 11/23/20 1902    Narrative:      1 BLUE+1 PURPLE    Culture Blood Aerobic and Anaerobic [id number] Collected: 11/23/20 1523    Specimen: Arm from Blood, Venipuncture Updated: 11/23/20 1902    Narrative:      1 BLUE+1 PURPLE    Troponin I [id number] Collected: 11/23/20 1523    Specimen: Blood Updated: 11/23/20 1644     Troponin I <0.01 ng/mL     Glucose Whole Blood - POCT [id number]  (Abnormal) Collected: 11/23/20 1634     Updated: 11/23/20 1637     Whole Blood Glucose POCT 192 mg/dL     Comprehensive metabolic panel  [id number]  (Abnormal) Collected: 11/23/20 1523    Specimen: Blood Updated: 11/23/20 1600     Glucose 224 mg/dL      BUN 52.0 mg/dL      Creatinine 3.6 mg/dL      Sodium 138 mEq/L      Potassium 6.4 mEq/L      Chloride 111 mEq/L      CO2 18 mEq/L      Calcium 9.1 mg/dL      Protein, Total 6.6 g/dL      Albumin 3.3 g/dL      AST (SGOT) 19 U/L      ALT 18 U/L      Alkaline Phosphatase 62 U/L      Bilirubin, Total 0.4 mg/dL      Globulin 3.3 g/dL      Albumin/Globulin Ratio 1.0     Anion Gap 9.0    Hemolysis index [id number] Collected: 11/23/20 1523     Updated: 11/23/20 1600     Hemolysis Index 7    GFR [id number] Collected: 11/23/20 1523     Updated: 11/23/20 1600     EGFR 16.1    Lactic Acid [id number] Collected: 11/23/20 1523    Specimen: Blood Updated: 11/23/20 1535     Lactic Acid 0.8 mmol/L     CBC and differential [id number]  (Abnormal) Collected: 11/23/20 1523    Specimen: Blood Updated: 11/23/20 1534     WBC 7.39 x10 3/uL      Hgb 8.4 g/dL      Hematocrit 26.7 %      Platelets 178 x10 3/uL      RBC 3.00 x10 6/uL      MCV 89.0 fL      MCH 28.0 pg      MCHC 31.5 g/dL      RDW 12 %      MPV 11.5 fL      Neutrophils 64.2 %      Lymphocytes Automated 29.2 %      Monocytes 4.7 %      Eosinophils Automated 1.2 %      Basophils Automated 0.4 %      Immature Granulocytes 0.3 %      Nucleated RBC 0.0 /100 WBC      Neutrophils Absolute 4.74 x10 3/uL      Lymphocytes Absolute Automated 2.16 x10 3/uL      Monocytes Absolute Automated  0.35 x10 3/uL      Eosinophils Absolute Automated 0.09 x10 3/uL      Basophils Absolute Automated 0.03 x10 3/uL      Immature Granulocytes Absolute 0.02 x10 3/uL      Absolute NRBC 0.00 x10 3/uL     Glucose Whole Blood - POCT [id number]  (Abnormal) Collected: 11/23/20 1440     Updated: 11/23/20 1442     Whole Blood Glucose POCT 205 mg/dL           Radiologic Studies -   Radiology Results (24 Hour)     Procedure Component Value Units Date/Time    XR Chest  AP Portable [id number]  Collected: 11/23/20 1601    Order Status: Completed Updated: 11/23/20 1608    Narrative:      XR CHEST AP PORTABLE: 11/23/2020 3:43 PM    CLINICAL INFORMATION:  +blood culture with possible infiltrate on ct imaging last visit.    COMPARISON:  CXR and CT dated 11/22/2020.    FINDINGS:  Ill-defined opacities are seen in the right lower lung corresponding to  findings on recent CT. No evidence of pleural effusion or pneumothorax.  Cardiomediastinal silhouette is within normal limits.  No acute osseous  abnormality.      Impression:        Ill-defined opacities at the right lower lung, suspicious for pneumonia.  Consider follow-up imaging in 4 weeks to confirm resolution.    Elnita Maxwell, MD   11/23/2020 4:06 PM          .    Doctor's Notes     Throughout the stay in the Emergency Department, questions and concerns surrounding pain control, care plans, diagnostic studies, effects of medications administered or prescribed, and future prognostic dilemmas were assessed and addressed.    ROS addendum: The patient and/or family was asked if they had any other complaints or concerns that we could address today and nothing of significance was noted.     VITALS:  Patient Vitals for the past 12 hrs:   BP Temp Pulse Resp   11/23/20 1905 (!) 176/92 98.2 F (36.8 C) 80 17   11/23/20 1823 177/86 98.4 F (36.9 C) 86 20   11/23/20 1700 (!) 190/91 -- 78 17   11/23/20 1436 (!) 158/91 99 F (37.2 C) 85 16   11/23/20 1423 159/84 98.3 F (36.8 C) 89 16     OLD RECORDS: visit to [facility] yesterday noted - per Dr. He she went in for feeling weak and almost passing out. Had elevated bsl the night before and gave herself 12u insulin at bedtime - had blood cultures done and 1/2 showed gram positive cocci resembling staph, ua not c/w infection and also lactate normal and no signs infection on CT imaging of abd but possible lung process noted on CT but not on xr (recommended repeat ct chest in 2-4 weeks)    EKG: sinus, rate 79. Possible lae. No stemi/st  depressions/pathologic twi    IMP & PLAN: 1/2 blood cultures positive which could be a contaminent but done within 24 hours so also possible true bacteremia. Will do xr today due to prior infiltrate and repeat lactate/cultures/urine/basic labs with attn to K levels. clinically well appearing and possible d/c with iv abx today and tomorrow via dispatch health pending all results.    ED COURSE:     4:20 PM - updated patient about kidney function and K level. She agrees to admission. Explained xr findings and need for abx  for now. Lactate reassuring.  Will do ekg and treatment for hyperkalemia    6:17 PM - bp noted. Dr. Ann Maki accepts IMCU inpatient. EKG without acute tw changes.  Thus IMCU.    CRITICAL CARE: The high probability of sudden, clinically significant deterioration in the patient's condition required the highest level of my preparedness to intervene urgently.    The services I provided to this patient were to treat and/or prevent clinically significant deterioration that could result in: sepsis, multi-organ failure, permanent kidney injury, arrhythmia, cardiac arrest, coma, death.  Services included the following: chart data review, reviewing nursing notes and/or old charts, documentation time, consultant collaboration regarding findings and treatment options, medication orders and management, direct patient care, re-evaluations, vital sign assessments and ordering, interpreting and reviewing diagnostic studies/lab tests.    Aggregate critical care time was 40 minutes, which includes only time during which I was engaged in work directly related to the patient's care, as described above, whether at the bedside or elsewhere in the Emergency Department.  It did not include time spent performing other reported procedures or the services of residents, students, nurses or physician assistants.    _______________________________  Medical DeMedical Decision Makingcision Making  Attestations:    I am the first  provider for this patient.    Kaleen Odea, MD is the primary emergency doctor of record.    I reviewed the vital signs, available nursing notes, past medical history, past surgical history, family history and social history.    _______________________________               Roena Malady, MD  11/23/20 [phone]

## 2020-11-23 NOTE — Progress Notes (Signed)
Admitted from ED to [address], [address] with no complaints today. Yesterday she was brought to the ED for syncope r/t low blood sugar and chills. She returned to [facility] today after getting a phone call notifying her of positive blood cx. IV Vanc infusing. Admission nav and paperwork completed including med reconciliation. Telemetry placed.     Pt oriented to surroundings and safety precautions in place. Advised pt to report any changes from baseline and/or new onset s/s immediately via call bell or directly to primary team, pt verbalized understanding.

## 2020-11-23 NOTE — ED Notes (Signed)
[facility]  ED NURSING NOTE FOR THE RECEIVING INPATIENT NURSE   ED NURSE Leander Rams [id number]   ED CHARGE RN [phone]   ADMISSION INFORMATION   Elizabeth Dunlap is a 50 y.o. female admitted with a diagnosis of:    1. Hyperkalemia    2. Positive blood culture    3. AKI (acute kidney injury) with chronic kidney disease    4. Type 2 diabetes mellitus without complication, with long-term current use of insulin         Isolation: None   Allergies: Penicillins and Ace inhibitors   Holding Orders confirmed? Yes   Belongings Documented? Yes   Home medications sent to pharmacy confirmed? No   NURSING CARE   Patient Comes From:   Mental Status: Home Independent  alert and oriented   ADL: Independent with all ADLs   Ambulation: mild difficulty   Pertinent Information  and Safety Concerns: elevated potassium, positive blood cultures     COVID Test sent to lab? Yes   VITAL SIGNS   Time BP Temp Pulse Resp SpO2   1823 177/86 98.4 86 20 100   CT / NIH   CT Head ordered on this patient?  No   NIH/Dysphagia assessment done prior to admission? No   PERSONAL PROTECTIVE EQUIPMENT   Face Shield and Gloves   LAB RESULTS   Labs Reviewed   CBC AND DIFFERENTIAL - Abnormal; Notable for the following components:       Result Value    Hgb 8.4 (*)     Hematocrit 26.7 (*)     RBC 3.00 (*)     All other components within normal limits   COMPREHENSIVE METABOLIC PANEL - Abnormal; Notable for the following components:    Glucose 224 (*)     BUN 52.0 (*)     Creatinine 3.6 (*)     Potassium 6.4 (*)     CO2 18 (*)     Albumin 3.3 (*)     All other components within normal limits   GLUCOSE WHOLE BLOOD - POCT - Abnormal; Notable for the following components:    Whole Blood Glucose POCT 205 (*)     All other components within normal limits   GLUCOSE WHOLE BLOOD - POCT - Abnormal; Notable for the following components:    Whole Blood Glucose POCT 192 (*)     All other components within normal limits   CULTURE BLOOD AEROBIC AND ANAEROBIC     Narrative:     1 BLUE+1 PURPLE   CULTURE BLOOD AEROBIC AND ANAEROBIC    Narrative:     1 BLUE+1 PURPLE   LACTIC ACID, PLASMA   HEMOLYSIS INDEX   GFR   TROPONIN I   URINALYSIS REFLEX TO MICROSCOPIC EXAM - REFLEX TO CULTURE          Ticket to Ride Printed: Yes

## 2020-11-24 ENCOUNTER — Inpatient Hospital Stay: Payer: BC Managed Care – PPO

## 2020-11-24 LAB — ECHOCARDIOGRAM ADULT COMPLETE W CLR/ DOPP WAVEFORM
AV Area (Cont Eq VTI): 2.141
AV Area (Cont Eq VTI): 2.176
AV Area (Cont Eq VTI): 2.178
AV Area (Cont Eq VTI): 2.178
AV Area (Cont Eq VTI): 2.205
AV Mean Gradient: 4.376
AV Mean Gradient: 4.482
AV Mean Gradient: 4.646
AV Mean Gradient: 4.729
AV Peak Velocity: 150.316
AV Peak Velocity: 151.115
AV Peak Velocity: 152.715
AV Peak Velocity: 159.112
Ao Root Diameter (2D): 2.783
BP Mod LV Ejection Fraction: 70.799
IVS Diastolic Thickness (2D): 1.523
LA Dimension (2D): 3.875
LA Volume Index (BP A-L): 0.033
LVID diastole (2D): 4.358
LVID systole (2D): 2.638
MV Area (PHT): 3.009
MV E/A: 0.689
MV E/A: 0.724
MV E/A: 0.734
MV E/A: 0.734
MV E/A: 0.737
MV E/e' (Average): 13.88
Mitral Valve Findings: NORMAL
Pulmonary Valve Findings: NORMAL
RV Basal Diastolic Dimension: 3.081
RV Function: NORMAL
RV Systolic Pressure: 14.601
RV Systolic Pressure: 25.554
RV Systolic Pressure: 31.17
RV Systolic Pressure: 40.389
RV Systolic Pressure: 40.389
Site RA Size (AS): NORMAL
Site RV Size (AS): NORMAL
TAPSE: 1.685
Tricuspid Valve Findings: NORMAL

## 2020-11-24 LAB — URINALYSIS REFLEX TO MICROSCOPIC EXAM - REFLEX TO CULTURE
Bilirubin, UA: NEGATIVE
Blood, UA: NEGATIVE
Glucose, UA: 150 — AB
Ketones UA: NEGATIVE
Leukocyte Esterase, UA: NEGATIVE
Nitrite, UA: NEGATIVE
Protein, UR: 100 — AB
Specific Gravity UA: 1.013 (ref 1.001–1.035)
Urine pH: 6 (ref 5.0–8.0)
Urobilinogen, UA: NEGATIVE mg/dL (ref 0.2–2.0)

## 2020-11-24 LAB — COMPREHENSIVE METABOLIC PANEL
ALT: 18 U/L (ref 0–55)
AST (SGOT): 17 U/L (ref 5–34)
Albumin/Globulin Ratio: 1 (ref 0.9–2.2)
Albumin: 2.9 g/dL — ABNORMAL LOW (ref 3.5–5.0)
Alkaline Phosphatase: 56 U/L (ref 37–106)
Anion Gap: 7 (ref 5.0–15.0)
BUN: 45 mg/dL — ABNORMAL HIGH (ref 7.0–19.0)
Bilirubin, Total: 0.3 mg/dL (ref 0.2–1.2)
CO2: 19 mEq/L — ABNORMAL LOW (ref 22–29)
Calcium: 8.6 mg/dL (ref 8.5–10.5)
Chloride: 113 mEq/L — ABNORMAL HIGH (ref 100–111)
Creatinine: 3.1 mg/dL — ABNORMAL HIGH (ref 0.6–1.0)
Globulin: 2.9 g/dL (ref 2.0–3.6)
Glucose: 189 mg/dL — ABNORMAL HIGH (ref 70–100)
Potassium: 4.8 mEq/L (ref 3.5–5.1)
Protein, Total: 5.8 g/dL — ABNORMAL LOW (ref 6.0–8.3)
Sodium: 139 mEq/L (ref 136–145)

## 2020-11-24 LAB — CBC AND DIFFERENTIAL
Absolute NRBC: 0 10*3/uL (ref 0.00–0.00)
Basophils Absolute Automated: 0.02 10*3/uL (ref 0.00–0.08)
Basophils Automated: 0.3 %
Eosinophils Absolute Automated: 0.13 10*3/uL (ref 0.00–0.44)
Eosinophils Automated: 2 %
Hematocrit: 26.5 % — ABNORMAL LOW (ref 34.7–43.7)
Hgb: 8.1 g/dL — ABNORMAL LOW (ref 11.4–14.8)
Immature Granulocytes Absolute: 0.04 10*3/uL (ref 0.00–0.07)
Immature Granulocytes: 0.6 %
Lymphocytes Absolute Automated: 2.36 10*3/uL (ref 0.42–3.22)
Lymphocytes Automated: 37 %
MCH: 27.1 pg (ref 25.1–33.5)
MCHC: 30.6 g/dL — ABNORMAL LOW (ref 31.5–35.8)
MCV: 88.6 fL (ref 78.0–96.0)
MPV: 11.2 fL (ref 8.9–12.5)
Monocytes Absolute Automated: 0.4 10*3/uL (ref 0.21–0.85)
Monocytes: 6.3 %
Neutrophils Absolute: 3.42 10*3/uL (ref 1.10–6.33)
Neutrophils: 53.8 %
Nucleated RBC: 0 /100 WBC (ref 0.0–0.0)
Platelets: 149 10*3/uL (ref 142–346)
RBC: 2.99 10*6/uL — ABNORMAL LOW (ref 3.90–5.10)
RDW: 12 % (ref 11–15)
WBC: 6.37 10*3/uL (ref 3.10–9.50)

## 2020-11-24 LAB — HEMOLYSIS INDEX: Hemolysis Index: -1 — ABNORMAL LOW (ref 0–18)

## 2020-11-24 LAB — GLUCOSE WHOLE BLOOD - POCT
Whole Blood Glucose POCT: 139 mg/dL — ABNORMAL HIGH (ref 70–100)
Whole Blood Glucose POCT: 155 mg/dL — ABNORMAL HIGH (ref 70–100)
Whole Blood Glucose POCT: 232 mg/dL — ABNORMAL HIGH (ref 70–100)
Whole Blood Glucose POCT: 240 mg/dL — ABNORMAL HIGH (ref 70–100)
Whole Blood Glucose POCT: 261 mg/dL — ABNORMAL HIGH (ref 70–100)

## 2020-11-24 LAB — HEMOGLOBIN A1C
Average Estimated Glucose: 191.5 mg/dL
Hemoglobin A1C: 8.3 % — ABNORMAL HIGH (ref 4.6–5.9)

## 2020-11-24 LAB — GFR: EGFR: 19.1

## 2020-11-24 LAB — TROPONIN I: Troponin I: <0.01 ng/mL (ref 0.00–0.05)

## 2020-11-24 LAB — PROCALCITONIN: Procalcitonin: 0.06 (ref 0.00–0.10)

## 2020-11-24 MED ORDER — SODIUM CHLORIDE 0.45 % IV SOLN
75.0000 mL/h | INTRAVENOUS | Status: DC
Start: 2020-11-24 — End: 2020-11-25
  Administered 2020-11-24: 13:00:00 75 mL/h via INTRAVENOUS
  Filled 2020-11-24 (×2): qty 1000

## 2020-11-24 MED ORDER — CLOPIDOGREL BISULFATE 75 MG PO TABS
75.0000 mg | ORAL_TABLET | Freq: Every day | ORAL | Status: DC
Start: 2020-11-24 — End: 2020-11-25
  Administered 2020-11-24 – 2020-11-25 (×2): 75 mg via ORAL
  Filled 2020-11-24 (×2): qty 1

## 2020-11-24 MED ORDER — RISAQUAD PO CAPS
1.0000 | ORAL_CAPSULE | Freq: Every day | ORAL | Status: DC
Start: 2020-11-25 — End: 2020-11-25
  Administered 2020-11-25: 10:00:00 1 via ORAL
  Filled 2020-11-24: qty 1

## 2020-11-24 NOTE — Progress Notes (Signed)
[facility]  PROGRESS NOTE      Patient: Elizabeth Dunlap  Date: 11/24/2020   LOS: 1 Days  Admission Date: 11/23/2020   MRN: [id number]  Attending: Dillard Essex, MD  Please contact me on epic chat        ASSESSMENT/PLAN     Elizabeth Dunlap is a 50 y.o. female admitted with Hyperkalemia    Interval Summary:     Patient Active Hospital Problem List:    Acute renal failure on CKD likely pre-renal   Stage IV CKD  Hyperkalemia--> resolved   Metabolic acidosis improving   -baseline  Cr is ~ 2.2  -Followed by Dr. Vista Lawman at [facility]--> unremarkable appearance of the kidneys and underdistended bladder  -Avoid nephrotoxins and hold home ARB  -continue IV hydration switch to bicarb drip   -Monitor renal function, lytes, UOP  -nephrology consulted discussed with Dr Laddie Aquas     Gram Positive Cocci bacteremia likely contaminant  Ruled out  pneumonia  -Afebrile, hemodynamically stable, no leukocytosis  -procalcitonin WNL.  -Repeat blood cultures  Negative thus far   -monitor without antibiotics   -Repeated 2 view CXR  Normal on 11/24/20   -check echocardiogram result   -Dr. Gustavus Messing consult appreciated.    Anemia of chronic disease  -Likely due to CKD  -Hgb in the 8 range  -Under care everywhere, labs show Hgb in the 9 range in 2020  -Monitor Hgb    Hypertensive urgency  -SBP 190s  -Resume home coreg  -Hold ARB due to worsening renal function    Type 2 DM  Recent episode of hypoglycemia to be due to combination of insulin and glipizide patient was advised to avoid combining glipizide with insulin  -Hold oral hypoglycemic agents  -Check A1c  -AC/HS acuchecks and low dose SSI    Hyperlipidemia - continue home crestor    CAD s/p stent in 2019  History of ischemic CM  Episode of non sustained SVT   -Sees [facility]  -Continue home medication aspirin, Plavix, Coreg, Crestor  Stated she still takes both aspirin and Plavix  -Holding ARB  Follow-up with cardiology as an outpatient    Class I obesity  History of  bariatric surgery      Analgesia: Tylenol as needed    Nutrition: Cardiac diet    DVT Prophylaxis: Heparin subcutaneous       Code Status: Full code    DISPO: Likely home on 11/25/2020       SUBJECTIVE     Elizabeth Dunlap states much better denies chest pain nausea vomiting abdominal pain    MEDICATIONS     Current Facility-Administered Medications   Medication Dose Route Frequency   . aspirin EC  81 mg Oral Daily   . carvedilol  25 mg Oral Q12H [place]   . heparin (porcine)  5,000 Units Subcutaneous Q12H [facility]   . insulin lispro  1-3 Units Subcutaneous QHS   . insulin lispro  1-5 Units Subcutaneous TID AC   . rosuvastatin  10 mg Oral Daily       PHYSICAL EXAM     Vitals:    11/24/20 1919   BP: 163/82   Pulse: 76   Resp: 18   Temp: 98.1 F (36.7 C)   SpO2: 99%       Temperature: Temp  Min: 97.9 F (36.6 C)  Max: 98.2 F (36.8 C)  Pulse: Pulse  Min: 69  Max: 82  Respiratory: Resp  Min: 14  Max: 18  Non-Invasive BP: BP  Min: 155/80  Max: 166/88  Pulse Oximetry SpO2  Min: 98 %  Max: 100 %    Intake and Output Summary (Last 24 hours) at Date Time  No intake or output data in the 24 hours ending 11/24/20 1940    GEN APPEARANCE: Normal;  A&OX3  HEENT: PERLA; EOMI; Conjunctiva Clear  NECK: Supple; No bruits  CVS: RRR, S1, S2; No M/G/R  LUNGS: CTAB; No Wheezes; No Rhonchi: No rales  ABD: Soft; No TTP; + Normoactive BS  EXT: No edema; Pulses 2+ and intact  Skin exam:  pink  NEURO: CN 2-12 intact; No Focal neurological deficits  CAP REFILL:  Normal  MENTAL STATUS:  Normal    Exam done by Dillard Essex, MD on 11/24/20 at 7:40 PM      LABS     Recent Labs   Lab 11/24/20  0504 11/23/20  1523 11/22/20  0754   WBC 6.37 7.39 11.03*   RBC 2.99* 3.00* 3.66*   Hgb 8.1* 8.4* 10.2*   Hematocrit 26.5* 26.7* 32.7*   MCV 88.6 89.0 89.3   Platelets 149 178 205       Recent Labs   Lab 11/24/20  0504 11/23/20  2159 11/23/20  1523 11/22/20  0754   Sodium 139 141 138 141   Potassium 4.8 4.8 6.4* 4.9   Chloride 113* 111 111 112*   CO2 19* 20* 18*  20*   BUN 45.0* 46.0* 52.0* 48.0*   Creatinine 3.1* 3.4* 3.6* 3.3*   Glucose 189* 171* 224* 165*   Calcium 8.6 9.1 9.1 9.5   Magnesium  --  1.5*  --   --        Recent Labs   Lab 11/24/20  0504 11/23/20  1523 11/22/20  0754   ALT '18 18 13   '$ AST (SGOT) '17 19 21   '$ Bilirubin, Total 0.3 0.4 0.3   Albumin 2.9* 3.3* 3.6   Alkaline Phosphatase 56 62 72       Recent Labs   Lab 11/24/20  0504 11/23/20  2139 11/23/20  1523   Troponin I <0.01 <0.01 <0.01       Recent Labs   Lab 11/22/20  0754   PT INR 1.0   PT 11.6   PTT 41*       Microbiology Results (last 15 days)     Procedure Component Value Units Date/Time    Culture Blood Aerobic and Anaerobic [id number] Collected: 11/23/20 1523    Order Status: Completed Specimen: Arm from Blood, Venipuncture Updated: 11/24/20 1921    Narrative:      ORDER#: [id number]                                    ORDERED BY: Jacklynn Ganong  SOURCE: Blood, Venipuncture arm                      COLLECTED:  11/23/20 15:23  ANTIBIOTICS AT COLL.:                                RECEIVED :  11/23/20 19:02  Culture Blood Aerobic and Anaerobic        PRELIM      11/24/20 19:21  11/24/20   No Growth after 1 day/s of incubation.  Culture Blood Aerobic and Anaerobic [id number] Collected: 11/23/20 1523    Order Status: Completed Specimen: Arm from Blood, Venipuncture Updated: 11/24/20 1921    Narrative:      ORDER#: [id number]                                    ORDERED BY: Jacklynn Ganong  SOURCE: Blood, Venipuncture arm                      COLLECTED:  11/23/20 15:23  ANTIBIOTICS AT COLL.:                                RECEIVED :  11/23/20 19:02  Culture Blood Aerobic and Anaerobic        PRELIM      11/24/20 19:21  11/24/20   No Growth after 1 day/s of incubation.      Culture Blood Aerobic and Anaerobic [id number] Collected: 11/22/20 0828    Order Status: Completed Specimen: Arm from Blood, Venipuncture Updated: 11/24/20 0959    Narrative:      [id number] called Micro Results of Positive Blood Culture.  Read back by: [id number],  by [id number] on 11/23/2020 at 08:30  ORDER#: [id number]                                    ORDERED BY: HE, ALBERT  SOURCE: Blood, Venipuncture Arm                      COLLECTED:  11/22/20 08:28  ANTIBIOTICS AT COLL.:                                RECEIVED :  11/22/20 11:28  [id number] called Micro Results of Positive Blood Culture. Read back by: [id number], by [id number] on 11/23/2020 at 08:30  Culture Blood Aerobic and Anaerobic        PRELIM      11/24/20 09:59   +  11/23/20   Aerobic Blood Culture Positive in less than 24 hrs             Gram Stain Shows: Gram positive cocci             Resembling Staphylococcus species  11/24/20   Anaerobic culture no growth to date, final report to follow  11/24/20   Growth of Staphylococcus hominis               Possible skin contaminant, susceptibility testing not             performed without request unless additional blood cultures,             collected within 48 hours of this culture, become positive.             Contact the laboratory for further information if necessary.        Culture Blood Aerobic and Anaerobic [id number] Collected: 11/22/20 0754    Order Status: Completed Specimen: Arm from Blood, Venipuncture Updated: 11/24/20 1221    Narrative:      ORDER#: [id number]  ORDERED BY: HE, ALBERT  SOURCE: Blood, Venipuncture arm                      COLLECTED:  11/22/20 07:54  ANTIBIOTICS AT COLL.:                                RECEIVED :  11/22/20 11:28  Culture Blood Aerobic and Anaerobic        PRELIM      11/24/20 12:21  11/23/20   No Growth after 1 day/s of incubation.  11/24/20   No Growth after 2 day/s of incubation.      COVID-19 (SARS-COV-2) [employer] Rapid) [id number] Collected: 11/22/20 0754    Order Status: Completed Specimen: Nasopharyngeal Swab from Nasopharynx Updated: 11/22/20 0842     Purpose of COVID testing Screening     SARS-CoV-2 Specimen Source Nasopharyngeal     SARS CoV-2 Negative     Comment: Test  performed using the Abbott ID NOW EUA assay.  Please see Fact Sheets for patients and providers located at:  http://olson-hall.info/    This test is for the qualitative detection of SARS-CoV-2  (COVID19) nucleic acid. Viral nucleic acids may persist in vivo,  independent of viability. Detection of viral nucleic acid does  not imply the presence of infectious virus, or that virus  nucleic acid is the cause of clinical symptoms. Negative  results should be treated as presumptive and, if inconsistent  with clinical signs and symptoms or necessary for patient  management, should be tested with an alternative molecular  assay. Negative results do not preclude SARS-CoV-2 infection  and should not be used as the sole basis for patient  management decisions. Invalid results may be due to inhibiting  substances in the specimen and recollection should occur.         Narrative:      o Collect and clearly label specimen type:  o Upper respiratory specimen: One Nasopharyngeal Dry Swab NO  Transport Media.  o Hand deliver to laboratory ASAP  Indication for testing->Extended care facility admission to  semi private room  Screening    Rapid influenza A/B antigens [id number] Collected: 11/22/20 0754    Order Status: Completed Specimen: Nasopharyngeal Swab from Nasal Aspirate Updated: 11/22/20 0833    Narrative:      ORDER#: [id number]                                    ORDERED BY: HE, ALBERT  SOURCE: Nasal Aspirate                               COLLECTED:  11/22/20 07:54  ANTIBIOTICS AT COLL.:                                RECEIVED :  11/22/20 08:14  Influenza Rapid Antigen A&B                FINAL       11/22/20 08:33  11/22/20   Negative for Influenza A and B             Reference Range: Negative             RADIOLOGY     Upon  my review:    Signed,  Dillard Essex, MD  7:40 PM 11/24/2020

## 2020-11-24 NOTE — UM Notes (Addendum)
Copy of Admission Order:  Admit to Inpatient (Order [id number]) 11/23/20 1819  Diagnosis: Hyperkalemia   Level of Care: Intermediate Care   Patient Class: Inpatient   References:    [facility] Bed Placement Criteria    [facility] Bed Placement Criteria    [facility] Bed Placement Criteria    [facility] Bed Placement Criteria    [facility] Bed Placement Criteria  Question Answer Comment  Admitting Physician Rebecka Apley   Service: Medicine   Estimated Length of Stay > or = to 2 midnights   Tentative Discharge Plan? Home or Self Care   Does patient need telemetry? Yes   Telemetry type (separate Telemetry order is also required): Cardiac telemetry     DIAGNOSIS    ICD-10-CM    1. Hyperkalemia  E87.5    2. Positive blood culture  R78.81    3. AKI (acute kidney injury) with chronic kidney disease  N17.9    4. Type 2 diabetes mellitus without complication, with long-term current use of insulin  E11.9     Z79.4          ADMITTED ON: 11/23/2020 at 1425 for Inpatient      PMH:  has a past medical history of Anemia, Asthma without status asthmaticus, Chronic kidney disease, Diabetes mellitus type II, controlled, DKA (diabetic ketoacidoses), GERD (gastroesophageal reflux disease), and Vision abnormalities.    Reason For Hospitalization:    Pt is a 50 y.o. female who arrived to the ER with C/O Abnormal Labs. Pt was seen at [facility] yesterday for hypoglycemia/not feeling well/diarrhea. Had imaging and labs including blood cultures. Able to maintain bsl and go home. States overnight she was fine. Eating well. Normal diabetes care. No abd pain. No vomiting. No fevers. No dizziness. Normal urination. No leg pain/swelling. No back pains. No bloody diarrhea. No rashes. Called by [facility] for positive blood culture and directed to ed. She has DM and notes she hasn't been taking good care of herself recently due to the fact that she has been caring for other family members and yesterday she was noted to have elevated kidney function tests also. covid and flu  neg yesterday.      Vitals:   Patient Vitals for the past 24 hrs:   BP Temp Temp src Pulse Resp SpO2 Weight   11/24/20 1158 166/88 98.1 F (36.7 C) Oral 74 16 98 % --   11/24/20 0937 163/77 -- -- 78 -- -- --   11/24/20 0750 163/84 97.9 F (36.6 C) Oral 69 14 100 % 87.7 kg (193 lb 4.8 oz)   11/23/20 2322 155/80 98.2 F (36.8 C) Oral 82 17 100 % --   11/23/20 1905 (!) 176/92 98.2 F (36.8 C) Oral 80 17 100 % --   11/23/20 1823 177/86 98.4 F (36.9 C) Oral 86 20 100 % --   11/23/20 1700 (!) 190/91 -- -- 78 17 100 % --   11/23/20 1436 (!) 158/91 99 F (37.2 C) Oral 85 16 100 % --         Temp:  [97.9 F (36.6 C)-99 F (37.2 C)]   Heart Rate:  [69-86]   Resp Rate:  [14-20]   BP: (155-190)/(77-92)   SpO2:  [98 %-100 %]   Weight:  [87.7 kg (193 lb 4.8 oz)]     Last recorded pain score:  Pain Scale Used: Numeric Scale (0-10)  Pain Score: 0-No pain     EKG:   EKG: sinus, rate 79. Possible lae. No stemi/st depressions/pathologic  twi      Abnormal Labs:    Lab Results last 48 Hours     Procedure Component Value Units Date/Time    Hemoglobin A1C [id number]  (Abnormal) Collected: 11/24/20 0504    Specimen: Blood Updated: 11/24/20 1000     Hemoglobin A1C 8.3 %      Average Estimated Glucose 191.5 mg/dL     Urinalysis Reflex to Microscopic Exam- Reflex to Culture [id number]  (Abnormal) Collected: 11/24/20 0840     Protein, UR 100     Glucose, UA 150    Glucose Whole Blood - POCT [id number]  (Abnormal) Collected: 11/24/20 0749     Updated: 11/24/20 0753     Whole Blood Glucose POCT 139 mg/dL     GFR [id number] Collected: 11/24/20 0504     Updated: 11/24/20 0550     EGFR 19.1    Comprehensive metabolic panel [id number]  (Abnormal) Collected: 11/24/20 0504    Specimen: Blood Updated: 11/24/20 0550     Glucose 189 mg/dL      BUN 45.0 mg/dL      Creatinine 3.1 mg/dL      Chloride 113 mEq/L      CO2 19 mEq/L      Protein, Total 5.8 g/dL      Albumin 2.9 g/dL     Hemolysis index [id number]  (Abnormal) Collected: 11/24/20  0504     Updated: 11/24/20 0550     Hemolysis Index -1    Troponin I [id number] Collected: 11/24/20 0504    Specimen: Blood Updated: 11/24/20 0549     Troponin I <0.01 ng/mL     CBC and differential [id number]  (Abnormal) Collected: 11/24/20 0504     Hgb 8.1 g/dL      Hematocrit 26.5 %      RBC 2.99 x10 6/uL      MCHC 30.6 g/dL     Glucose Whole Blood - POCT [id number]  (Abnormal) Collected: 11/23/20 2347     Updated: 11/24/20 0046     Whole Blood Glucose POCT 232 mg/dL     Basic Metabolic Panel [id number]  (Abnormal) Collected: 11/23/20 2159    Specimen: Blood Updated: 11/23/20 2255     Glucose 171 mg/dL      BUN 46.0 mg/dL      Creatinine 3.4 mg/dL      CO2 20 mEq/L     Magnesium [id number]  (Abnormal) Collected: 11/23/20 2159    Specimen: Blood Updated: 11/23/20 2255     Magnesium 1.5 mg/dL     GFR [id number] Collected: 11/23/20 2159     Updated: 11/23/20 2255     EGFR 17.2    Troponin I [id number] Collected: 11/23/20 2139     Troponin I <0.01 ng/mL     Glucose Whole Blood - POCT [id number]  (Abnormal) Collected: 11/23/20 2108     Updated: 11/23/20 2118     Whole Blood Glucose POCT 141 mg/dL     Troponin I [id number] Collected: 11/23/20 1523    Specimen: Blood Updated: 11/23/20 1644     Troponin I <0.01 ng/mL     Glucose Whole Blood - POCT [id number]  (Abnormal) Collected: 11/23/20 1634     Updated: 11/23/20 1637     Whole Blood Glucose POCT 192 mg/dL     Comprehensive metabolic panel [id number]  (Abnormal) Collected: 11/23/20 1523    Specimen: Blood Updated: 11/23/20 1600     Glucose 224 mg/dL      BUN 52.0 mg/dL  Creatinine 3.6 mg/dL      Potassium 6.4 mEq/L      CO2 18 mEq/L      Albumin 3.3 g/dL     GFR [id number] Collected: 11/23/20 1523     Updated: 11/23/20 1600     EGFR 16.1    CBC and differential [id number]  (Abnormal) Collected: 11/23/20 1523     Hgb 8.4 g/dL      Hematocrit 26.7 %      RBC 3.00 x10 6/uL     Glucose Whole Blood - POCT [id number]  (Abnormal) Collected: 11/23/20 1440      Updated: 11/23/20 1442     Whole Blood Glucose POCT 205 mg/dL             Radiologic Exams:   CT Abd/ Pelvis without Contrast    Result Date: 11/22/2020    1. No acute process in the abdomen or pelvis on this noncontrast examination. 2. 9 mm hazy nodular opacity in the right lower lobe. This could be postinfectious or postinflammatory or even related to a component of atelectasis. Suggest follow-up CT of the chest in 2-4 weeks. Mitali Bapna, MD  11/22/2020 10:25 AM    XR Chest 2 Views    Result Date: 11/24/2020   No active disease is seen in the chest. Elyn Peers, MD  11/24/2020 8:59 AM    CT Head WO Contrast    Result Date: 11/22/2020    No acute intracranial abnormality. Mitali Bapna, MD  11/22/2020 8:25 AM    XR Chest  AP Portable    Result Date: 11/23/2020  Ill-defined opacities at the right lower lung, suspicious for pneumonia. Consider follow-up imaging in 4 weeks to confirm resolution. Elnita Maxwell, MD  11/23/2020 4:06 PM     XR Chest AP Portable    Result Date: 11/22/2020  No focal consolidation, pleural effusion, or pneumothorax. Cynda Familia, MD  11/22/2020 8:12 AM        ED meds:   Medication Administration from 11/23/2020 1350 to 11/23/2020 1903   Date/Time Order Dose Route Action Action by Comments    11/23/2020 1635 sodium chloride 0.9 % bolus 1,000 mL 0 mL Intravenous Stopped Elsie Stain, RN     11/23/2020 1535 sodium chloride 0.9 % bolus 1,000 mL 1,000 mL Intravenous [address] Iran Ouch, RN     11/23/2020 1811 vancomycin (VANCOCIN) 1,750 mg in sodium chloride 0.9 % 500 mL IVPB 1,750 mg Intravenous [address] Elsie Stain, RN     11/23/2020 1642 calcium GLUConate 10 % injection 1 g 1 g Intravenous Given Elsie Stain, RN     11/23/2020 1644 sodium bicarbonate 8.4 % injection 50 mEq 50 mEq Intravenous Given Elsie Stain, RN     11/23/2020 1809 insulin regular (HumuLIN R) 5 Units intravenous injection 5 Units Intravenous Given Elsie Stain, RN     11/23/2020 1755 dextrose (D10W) 10% bolus  0-500 mL 0 mL Intravenous Stopped Elsie Stain, RN     11/23/2020 1700 dextrose (D10W) 10% bolus 0-500 mL 500 mL Intravenous [address] Elsie Stain, RN     11/23/2020 1641 sodium polystyrene (KAYEXALATE) 15 GM/60ML suspension 30 g 30 g Oral Given Elsie Stain, RN     11/23/2020 1824 azithromycin (ZITHROMAX) tablet 500 mg 500 mg Oral Given Elsie Stain, RN     11/23/2020 1827 amLODIPine (NORVASC) tablet 10 mg 10 mg Oral Given Elsie Stain, RN BP 177/86       Treatment Plan:   H&P 11/23/2020  ASSESSMENT & PLAN  Elizabeth Dunlap is a 50 y.o. female with history of coronary artery disease status post stenting in 2019, ischemic cardiomyopathy bariatric surgery in 2020, chronic kidney disease type 2, diabetes mellitus, hypertension, hyperlipidemia, anemia, GERD who was advised to return to the ED for abnormal lab results. She was seen at the [facility] yesterday for presyncope/weakness due to hypoglycemia and chills.  She reports she took glipizide 2.5 and taking insulin 12 units novolog the night prior when her BG > 300. She was asked to return to [facility] today after 1 of 2 sets of blood cultures showed gram-positive cocci.   The appropriate admission status for this patient isinpatient.Inpatientstatus is judged to be reasonable and necessary in order to provide the required intensity of service to ensure the patient's safety. The patient's presenting symptoms, physical exam findings, and initial radiographic and laboratory data in the context of their chronic comorbidities is felt to place them at high risk for further clinical deterioration. Furthermore, it is not anticipated that the patient will be medically stable for discharge from the hospital within 2 midnights of admission. The following factors support the admission status ofinpatient. This is due to the patient's presenting symptoms of chills, worrisome laboratory data of creatinine of 3.6 and K of 6.6 and significant comorbidities  including hypertension, diabetes, ischemic cardiomyopathy, CAD status post stents, stage IV CKD.  Patient Active Hospital Problem List:  Acute renal failure  Stage IV CKD  Hyperkalemia, K 6.6 s/p kayexalate, [id number]  Metabolic acidosis  -It appears from lab review under care everywhere that patients Cr is ~ 2.2  -Followed by Dr. Vista Lawman at [place] [facility]  -Avoid nephrotoxins and hold home ARB  -IVF hydration, NS'@100'$ /hr  -Start sodium bicarbonate  -Monitor renal function, lytes, UOP  -If renal function worsens, please call nephrology  Gram Positive Cocci bacteremia  Probable right lower lung pneumonia  -Afebrile, hemodynamically stable, no leukocytosis  -Check procalcitonin  -Repeat blood cultures sent   -Continue vancomycin, start merrem (PCN allergy)  -Repeat 2 view CXR in AM  -Obtain echocardiogram in light of gram positive bacteremia  -ID, Dr. Gustavus Messing consulted  Anemia of chronic disease  -Likely due to CKD  -Hgb in the 8 range  -Under care everywhere, labs show Hgb in the 9 range in 2020  -Monitor Hgb  Hypertensive urgency  -SBP 190s  -Resume home coreg  -Hold ARB due to worsening renal function  Type 2 DM  -Hold oral hypoglycemic agents  -Check A1c  -AC/HS acuchecks and low dose SSI  Hyperlipidemia - continue home crestor  CAD s/p stent in 2019  History of ischemic CM  -Sees [facility]  -Con tine home ASA, crestor, coreg  -Holding ARB  History of bariatric surgery  Nutrition  Renal, carb controlled  OTHER  Code Status: FULL  DVT prophylaxis:  Heparin   I certify that at the point of admission it is my clinical judgment that the patient will requireinpatienthospital care spanning beyond 2 midnights from the point of admission due to high intensity of service, high risk for further deterioration and high frequency of surveillance required.    Nephrology 11/24/2020  Reason for Consultation:   AKI on CKD  Assessment:   AKI on CKD IIIb              Etiology?? Prerenal ??              UA SG 1013,  protein 100, glucose 150, RBC 0-2, WBC 0-5   Ct  scan mild B/L perinephric stranding   CKD IIIb baseline Cr 2.1-2.4 ( 08/2019) due yo DM2   Hyperkalemia   Positive blood c/s   Type II DM   CAD   Ischemic CM   HTN   HLD   GERD  Plan:   Changed IVF to 1/2 NS with 75 meq HCO3 at 100 ml an hour   Low K diet   Strict I/O   Daily labs   Will follow      Orders  Scheduled Meds:  Current Facility-Administered Medications   Medication Dose Route Frequency   . aspirin EC  81 mg Oral Daily   . carvedilol  25 mg Oral Q12H [place]   . heparin (porcine)  5,000 Units Subcutaneous Q12H [facility]   . insulin lispro  1-3 Units Subcutaneous QHS   . insulin lispro  1-5 Units Subcutaneous TID AC   . rosuvastatin  10 mg Oral Daily   . sodium bicarbonate  650 mg Oral QID     Continuous Infusions:  . IV fluids with sodium bicarbonate 75 mL/hr (11/24/20 1318)     PRN Meds:.acetaminophen **OR** acetaminophen, Nursing communication: Adult Hypoglycemia Treatment Algorithm **AND** glucagon (rDNA) **AND** dextrose **AND** dextrose **AND** dextrose, [COMPLETED] insulin regular **AND** Oral Glucose for BG 250 mg/dL or less **AND** dextrose **AND** [COMPLETED] dextrose **AND** dextrose **AND** [EXPIRED] NSG Communication: Glucose POCT order, dextrose, labetalol, melatonin, naloxone, ondansetron **OR** ondansetron        This clinical review is based on compiled documentation provided by the treatment team within the patient's medical record.  ________________________________________      UTILIZATION REVIEW CONTACT :   Regis Bill, MSN, RN, ACM  Utilization Review Case Manager ll   Phone: [phone] (vm only)  [place]: [phone]  Cell [phone] (8a-5p)  [email or web address]  NPI: [id number] Tax ID:  [id number]

## 2020-11-24 NOTE — Consults (Addendum)
Infectious Diseases and Tropical Medicine Consult  Heywood Footman, MD          Date Time: 11/24/20 10:23 AM  Patient Name: Elizabeth Dunlap  Referring Physician: Dillard Essex, MD      Reason for Consultation:      Bacteremia    Assessment:      Encephalopathy likely secondary to hypoglycemia   Blood culture-staph hominis (1/2)-likely contamination   Repeat blood cultures no growth to date   Risk of aspiration   Diabetes mellitus   Coronary artery disease   Ischemic cardiomyopathy   Renal insufficiency   Normal anion gap acidosis likely secondary to renal insufficiency-nephrology following   No fever or leukocytosis   Clinically stable    Recommendations:      Monitor without antibiotics   Follow-up chest x-ray   Probiotics    Monitor electrolytes and renal functions closely   Monitor clinically   Discussed with patient in detail                                         History of Present Illness:     Elizabeth Dunlap is a 50 year old female with a history of diabetes mellitus, hypertension, hyperlipidemia, coronary artery disease, ischemic cardiomyopathy, chronic kidney disease, gastroesophageal reflux disease is admitted for positive blood cultures.  Patient was seen in [facility] 1 day prior to admission for dizziness, generalized weakness and chills secondary to hypoglycemia.  No vomiting or diarrhea.  No chest pain cough or shortness of breath.  No skin rashes.  In the emergency room patient temperature is 98.3, blood pressure 190/91, pulse of 89 bpm, leukocyte count of 11.0 and blood cultures growing staph hominis (1/2).  Chest x-ray shows no active disease.  Patient is currently on vancomycin and meropenem.    Past Medical History:   Diagnosis Date   . Anemia    . Asthma without status asthmaticus     childhood   . Chronic kidney disease    . Diabetes mellitus type II, controlled    . DKA (diabetic ketoacidoses)    . GERD (gastroesophageal reflux disease)     occ.   . Vision abnormalities          Past Surgical History:   Procedure Laterality Date   . CESAREAN SECTION     . CORONARY ANGIOPLASTY WITH STENT PLACEMENT     . HYSTEROSCOPY, ENDOMETRIAL ABLATION, THERMACHOICE  10/08/2012    Procedure: HYSTEROSCOPY, ENDOMETRIAL ABLATION, THERMACHOICE;  Surgeon: Larae Grooms, MD;  Location: [facility];  Service: Gynecology;  Laterality: N/A;  thermachoice        No family history on file.     Social History     Socioeconomic History   . Marital status: Single     Spouse name: None   . Number of children: None   . Years of education: None   . Highest education level: None   Occupational History   . None   Tobacco Use   . Smoking status: Never Smoker   . Smokeless tobacco: Never Used   Vaping Use   . Vaping Use: Never used   Substance and Sexual Activity   . Alcohol use: No   . Drug use: No   . Sexual activity: None   Other Topics Concern   . None   Social History Narrative   . None  Social Determinants of Health     Financial Resource Strain: Not on file   Food Insecurity: Not on file   Transportation Needs: Not on file   Physical Activity: Not on file   Stress: Not on file   Social Connections: Not on file   Intimate Partner Violence: Not on file   Housing Stability: Not on file        Allergies:     Allergies   Allergen Reactions   . Penicillins Other (See Comments)     UNKNOWN   . Ace Inhibitors      cough        Review of Systems:     General:  Comfortable, no acute distress, no fever or chills  HEENT: no runny nose or sore throat  Respiratory: no cough or shortness of breath, no hematemesis or hemoptysis  Cardiac: no chest pain  Abdomen: no abdominal pain, no nausea vomiting or diarrhea  Neurologic: awake and alert  Genitourinary: no dysuria or hematuria  Extremities: no joint pains or swelling  Dermatologic: no skin rashes, no itching    Physical Exam:     Blood pressure 163/77, pulse 78, temperature 97.9 F (36.6 C), temperature source Oral, resp. rate 14, height 1.651 m ('5\' 5"'$ ), weight 87.7 kg  (193 lb 4.8 oz), SpO2 100 %.    General Appearance: Comfortable  HEENT:  Head is normocephalic, atraumatic, pupils are equal and reactive to light  Neck:    Supple,No neck lymphadenopathy, no thyromegaly  Lungs:  Clear to auscultation   Chest Wall: Symmetric chest wall expansion.   Heart : Regular rate and rhythm, no murmur or gallop  Abdomen: Abdomen is soft, nontender, good bowel sounds, no hepatosplenomegaly  Neurological: Awake and alert, normal muscle strength, no focal deficit  Extremities: No edema, no clubbing or cyanosis  Skin:  Warm and dry.No rash or ecchymosis.   Psychiatric: Mood and affect is normal    Labs:     Recent Labs     11/24/20  0504 11/23/20  1523   WBC 6.37 7.39   Hgb 8.1* 8.4*   Hematocrit 26.5* 26.7*   Platelets 149 178   MCV 88.6 89.0       Recent Labs     11/24/20  0504 11/23/20  2159   Sodium 139 141   Potassium 4.8 4.8   Chloride 113* 111   CO2 19* 20*   BUN 45.0* 46.0*   Creatinine 3.1* 3.4*   Glucose 189* 171*   Calcium 8.6 9.1   Magnesium  --  1.5*   Phosphorus  --  4.6       Recent Labs     11/24/20  0504 11/23/20  1523   AST (SGOT) 17 19   ALT 18 18   Alkaline Phosphatase 56 62   Protein, Total 5.8* 6.6   Albumin 2.9* 3.3*   Bilirubin, Total 0.3 0.4         Imaging studies:           Thanks for the consultation          Signed by: Heywood Footman, MD, MD  Date Time: 11/24/20 10:23 AM      *This note was generated by the Epic EMR system/ Dragon speech recognition and may contain inherent errors or omissions not intended by the user. Grammatical errors, random word insertions, deletions, pronoun errors and incomplete sentences are occasional consequences of this technology due to software limitations. Not all errors are  caught or corrected. If there are questions or concerns about the content of this note or information contained within the body of this dictation they should be addressed directly with the author for clarification

## 2020-11-24 NOTE — Plan of Care (Signed)
A&O X4. Denies pain. SR on the tele monitor. On RA.  Renal ultrasound done, result pending. Continued on bicarb drip.  Safety measures in place. Will continue to monitor.   Problem: Diabetes: Glucose Imbalance  Goal: Blood glucose stable at established goal  Outcome: Progressing  Flowsheets (Taken 11/24/2020 1335)  Blood glucose stable at established goal:   Monitor lab values   Include patient/family in decisions related to nutrition/dietary selections   Assess for hypoglycemia /hyperglycemia   Monitor/assess vital signs   Coordinate medication administration with meals, as indicated     Problem: Safety  Goal: Patient will be free from injury during hospitalization  Outcome: Progressing  Flowsheets (Taken 11/24/2020 1335)  Patient will be free from injury during hospitalization:   Assess patient's risk for falls and implement fall prevention plan of care per policy   Provide and maintain safe environment   Use appropriate transfer methods   Hourly rounding   Ensure appropriate safety devices are available at the bedside   Include patient/ family/ care giver in decisions related to safety     Problem: Pain  Goal: Pain at adequate level as identified by patient  Outcome: Progressing  Flowsheets (Taken 11/24/2020 1335)  Pain at adequate level as identified by patient:   Identify patient comfort function goal   Assess pain on admission, during daily assessment and/or before any "as needed" intervention(s)   Reassess pain within 30-60 minutes of any procedure/intervention, per Pain Assessment, Intervention, Reassessment (AIR) Cycle   Evaluate if patient comfort function goal is met     Problem: Hemodynamic Status: Cardiac  Goal: Stable vital signs and fluid balance  Outcome: Progressing  Flowsheets (Taken 11/24/2020 1335)  Stable vital signs and fluid balance:   Monitor/assess vital signs and telemetry per unit protocol   Assess signs and symptoms associated with cardiac rhythm changes   Weigh on admission and record weight  daily   Monitor lab values   Monitor intake/output per unit protocol and/or LIP order     Problem: Renal Instability  Goal: Fluid and electrolyte balance are achieved/maintained  Outcome: Progressing  Flowsheets (Taken 11/24/2020 1335)  Fluid and electrolyte balance are achieved/maintained:   Monitor intake and output every shift   Provide adequate hydration   Monitor/assess lab values and report abnormal values   Monitor daily weight   Assess and reassess fluid and electrolyte status   Assess for confusion/personality changes   Observe for cardiac arrhythmias

## 2020-11-24 NOTE — Consults (Signed)
[facility]  Consultation    Date Time: 11/24/20 12:11 PM  Patient Name: Elizabeth Dunlap, Elizabeth Dunlap  Medical Record Number: [id number]   Primary Care Physician: Nathanial Rancher, MD  Requesting Physician: Dillard Essex, MD    Reason for Consultation:   AKI on CKD  Assessment:    AKI on CKD IIIb   Etiology?? Prerenal ??   UA SG 1013, protein 100, glucose 150, RBC 0-2, WBC 0-5   Ct scan mild B/L perinephric stranding   CKD IIIb baseline Cr 2.1-2.4 ( 08/2019) due yo DM2   Hyperkalemia   Positive blood c/s   Type II DM   CAD   Ischemic CM   HTN   HLD   GERD  Plan:    Changed IVF to 1/2 NS with 75 meq HCO3 at 100 ml an hour   Low K diet   Strict I/O   Daily labs   Will follow    We will follow this patient closely with you. Thank you for allowing [place] to participate in the care of this patient.    Dayna Barker, MD  Epic Secure Chat  Office - [phone]  History:   Elizabeth Dunlap is a 50 y.o. female with history of coronary artery disease status post stenting in 2019, ischemic cardiomyopathy bariatric surgery in 2020, chronic kidney disease IIB base line Cr 2.1-2.4 ( care everywhere)  type 2, diabetes mellitus, hypertension, hyperlipidemia, anemia, GERD who was advised to return to the ED for positive blood c/s.    She denies any diarrhea, N/V, S/S of UTI or poor oral intake. She follows nephrology at [facility] and has not seen them for some time  Past Medical and Surgical  History:     Past Medical History:   Diagnosis Date   . Anemia    . Asthma without status asthmaticus     childhood   . Chronic kidney disease    . Diabetes mellitus type II, controlled    . DKA (diabetic ketoacidoses)    . GERD (gastroesophageal reflux disease)     occ.   . Vision abnormalities      Past Surgical History:   Procedure Laterality Date   . CESAREAN SECTION     . CORONARY ANGIOPLASTY WITH STENT PLACEMENT     . HYSTEROSCOPY, ENDOMETRIAL ABLATION, THERMACHOICE  10/08/2012    Procedure: HYSTEROSCOPY, ENDOMETRIAL  ABLATION, THERMACHOICE;  Surgeon: Larae Grooms, MD;  Location: [facility];  Service: Gynecology;  Laterality: N/A;  thermachoice     Family History:   No family history on file.  Social History:     Social History     Socioeconomic History   . Marital status: Single   Tobacco Use   . Smoking status: Never Smoker   . Smokeless tobacco: Never Used   Vaping Use   . Vaping Use: Never used   Substance and Sexual Activity   . Alcohol use: No   . Drug use: No     Allergies:     Allergies   Allergen Reactions   . Penicillins Other (See Comments)     UNKNOWN   . Ace Inhibitors      cough     Medications:     Current Facility-Administered Medications   Medication Dose Route Frequency   . aspirin EC  81 mg Oral Daily   . carvedilol  25 mg Oral Q12H [place]   . heparin (porcine)  5,000 Units Subcutaneous Q12H [facility]   .  insulin lispro  1-3 Units Subcutaneous QHS   . insulin lispro  1-5 Units Subcutaneous TID AC   . rosuvastatin  10 mg Oral Daily   . sodium bicarbonate  650 mg Oral QID     Review of Systems:   10 systems were reviewed and were negative  Physical Exam:   Temp:  [97.9 F (36.6 C)-99 F (37.2 C)] 98.1 F (36.7 C)  Heart Rate:  [69-89] 74  Resp Rate:  [14-20] 16  BP: (155-190)/(77-92) 166/88  Intake and Output Summary (Last 24 hours) at Date Time  No intake or output data in the 24 hours ending 11/24/20 1211    Gen: Well developed, no acute distress  HEENT: NC/AT, moist mucous membranes  Eyes: Anicteric sclera, PERRLA  Neck:  Supple,  CV: S1 S2 N RRR no M/R/G/C, no edema  Chest: Good effort, CTAB, symmetric expansion  Ab: ND NT Soft no HSM +BS  Skin: Dry, intact  Extremity: No rashes  Psych: Appropriate mood and affect, alert and oriented to person, place, time  Labs:     Recent Labs   Lab 11/24/20  0504 11/23/20  2159 11/23/20  1523 11/22/20  0754   Glucose 189* 171* 224* 165*   BUN 45.0* 46.0* 52.0* 48.0*   Creatinine 3.1* 3.4* 3.6* 3.3*   Calcium 8.6 9.1 9.1 9.5   Sodium 139 141 138 141   Potassium 4.8 4.8 6.4*  4.9   Chloride 113* 111 111 112*   CO2 19* 20* 18* 20*   Albumin 2.9*  --  3.3* 3.6   Phosphorus  --  4.6  --   --    Magnesium  --  1.5*  --   --      Recent Labs   Lab 11/24/20  0504 11/23/20  1523 11/22/20  0754   WBC 6.37 7.39 11.03*   RBC 2.99* 3.00* 3.66*   Hgb 8.1* 8.4* 10.2*   Hematocrit 26.5* 26.7* 32.7*   MCV 88.6 89.0 89.3   MCH 27.1 28.0 27.9   MCHC 30.6* 31.5 31.2*   RDW '12 12 12   '$ MPV 11.2 11.5 11.5   Platelets 149 178 205     Radiology:   Radiological Procedure personally reviewed.   EKG:   Reviewed  Prior Records:   I reviewed the old records.

## 2020-11-24 NOTE — Plan of Care (Addendum)
Pt alert and oriented, independent with ADLs, vitals WNL, IVF running, call bell in reach, will continue to monitor.    Pt had no baseline changes through shift, labs drawn, vitals WNL, IVF maintained, no acute concerns noted. Will pass at report.      Problem: Fluid and Electrolyte Imbalance/ Endocrine  Goal: Fluid and electrolyte balance are achieved/maintained  Outcome: Progressing     Problem: Diabetes: Glucose Imbalance  Goal: Blood glucose stable at established goal  Outcome: Progressing     Problem: Safety  Goal: Patient will be free from injury during hospitalization  Outcome: Progressing     Problem: Pain  Goal: Pain at adequate level as identified by patient  Outcome: Progressing     Problem: Hemodynamic Status: Cardiac  Goal: Stable vital signs and fluid balance  Outcome: Progressing     Problem: Renal Instability  Goal: Fluid and electrolyte balance are achieved/maintained  Outcome: Progressing

## 2020-11-25 DIAGNOSIS — E876 Hypokalemia: Secondary | ICD-10-CM | POA: Diagnosis present

## 2020-11-25 DIAGNOSIS — N184 Chronic kidney disease, stage 4 (severe): Secondary | ICD-10-CM | POA: Diagnosis present

## 2020-11-25 DIAGNOSIS — N179 Acute kidney failure, unspecified: Secondary | ICD-10-CM | POA: Diagnosis present

## 2020-11-25 DIAGNOSIS — E669 Obesity, unspecified: Secondary | ICD-10-CM | POA: Diagnosis present

## 2020-11-25 DIAGNOSIS — I160 Hypertensive urgency: Secondary | ICD-10-CM | POA: Diagnosis present

## 2020-11-25 DIAGNOSIS — Z794 Long term (current) use of insulin: Secondary | ICD-10-CM

## 2020-11-25 DIAGNOSIS — E119 Type 2 diabetes mellitus without complications: Secondary | ICD-10-CM

## 2020-11-25 DIAGNOSIS — I2510 Atherosclerotic heart disease of native coronary artery without angina pectoris: Secondary | ICD-10-CM | POA: Diagnosis present

## 2020-11-25 DIAGNOSIS — R7881 Bacteremia: Secondary | ICD-10-CM | POA: Diagnosis present

## 2020-11-25 LAB — CBC AND DIFFERENTIAL
Absolute NRBC: 0 10*3/uL (ref 0.00–0.00)
Basophils Absolute Automated: 0.02 10*3/uL (ref 0.00–0.08)
Basophils Automated: 0.4 %
Eosinophils Absolute Automated: 0.1 10*3/uL (ref 0.00–0.44)
Eosinophils Automated: 1.9 %
Hematocrit: 25.3 % — ABNORMAL LOW (ref 34.7–43.7)
Hgb: 7.9 g/dL — ABNORMAL LOW (ref 11.4–14.8)
Immature Granulocytes Absolute: 0.01 10*3/uL (ref 0.00–0.07)
Immature Granulocytes: 0.2 %
Lymphocytes Absolute Automated: 2.19 10*3/uL (ref 0.42–3.22)
Lymphocytes Automated: 40.6 %
MCH: 27.7 pg (ref 25.1–33.5)
MCHC: 31.2 g/dL — ABNORMAL LOW (ref 31.5–35.8)
MCV: 88.8 fL (ref 78.0–96.0)
MPV: 12 fL (ref 8.9–12.5)
Monocytes Absolute Automated: 0.33 10*3/uL (ref 0.21–0.85)
Monocytes: 6.1 %
Neutrophils Absolute: 2.74 10*3/uL (ref 1.10–6.33)
Neutrophils: 50.8 %
Nucleated RBC: 0 /100 WBC (ref 0.0–0.0)
Platelets: 160 10*3/uL (ref 142–346)
RBC: 2.85 10*6/uL — ABNORMAL LOW (ref 3.90–5.10)
RDW: 12 % (ref 11–15)
WBC: 5.39 10*3/uL (ref 3.10–9.50)

## 2020-11-25 LAB — HEMOLYSIS INDEX: Hemolysis Index: 0 (ref 0–18)

## 2020-11-25 LAB — ECG 12-LEAD
Atrial Rate: 66 {beats}/min
Atrial Rate: 79 {beats}/min
P Axis: 54 degrees
P Axis: 34 degrees
P-R Interval: 144 ms
P-R Interval: 138 ms
Q-T Interval: 432 ms
Q-T Interval: 360 ms
QRS Duration: 70 ms
QRS Duration: 66 ms
QTC Calculation (Bezet): 452 ms
QTC Calculation (Bezet): 412 ms
R Axis: 10 degrees
R Axis: -3 degrees
T Axis: 49 degrees
T Axis: 35 degrees
Ventricular Rate: 66 {beats}/min
Ventricular Rate: 79 {beats}/min

## 2020-11-25 LAB — BASIC METABOLIC PANEL
Anion Gap: 8 (ref 5.0–15.0)
BUN: 36 mg/dL — ABNORMAL HIGH (ref 7.0–19.0)
CO2: 20 mEq/L — ABNORMAL LOW (ref 22–29)
Calcium: 8.4 mg/dL — ABNORMAL LOW (ref 8.5–10.5)
Chloride: 112 mEq/L — ABNORMAL HIGH (ref 100–111)
Creatinine: 2.8 mg/dL — ABNORMAL HIGH (ref 0.6–1.0)
Glucose: 259 mg/dL — ABNORMAL HIGH (ref 70–100)
Potassium: 4.9 mEq/L (ref 3.5–5.1)
Sodium: 140 mEq/L (ref 136–145)

## 2020-11-25 LAB — MAGNESIUM: Magnesium: 1.7 mg/dL (ref 1.6–2.6)

## 2020-11-25 LAB — GLUCOSE WHOLE BLOOD - POCT
Whole Blood Glucose POCT: 139 mg/dL — ABNORMAL HIGH (ref 70–100)
Whole Blood Glucose POCT: 180 mg/dL — ABNORMAL HIGH (ref 70–100)

## 2020-11-25 LAB — GFR: EGFR: 21.5

## 2020-11-25 MED ORDER — HYDRALAZINE HCL 25 MG PO TABS
25.0000 mg | ORAL_TABLET | Freq: Three times a day (TID) | ORAL | Status: DC
Start: 2020-11-25 — End: 2020-11-25
  Administered 2020-11-25: 13:00:00 25 mg via ORAL
  Filled 2020-11-25: qty 1

## 2020-11-25 MED ORDER — HYDRALAZINE HCL 25 MG PO TABS
25.0000 mg | ORAL_TABLET | Freq: Three times a day (TID) | ORAL | 0 refills | Status: AC
Start: 2020-11-25 — End: 2020-12-25

## 2020-11-25 NOTE — Progress Notes (Signed)
Entered in error

## 2020-11-25 NOTE — Progress Notes (Signed)
Case Management Initial Assessment and Discharge Planning:         Situation  DX: hypoglycemia, presyncope/ weakness   Background     Pt presented to ER from [facility]. HX: DM, CKD, bariatric surgery, ischemic cardiomyopathy, HLD, anemia, GERD, HTN, CAD, stent 2019     Assessment        Pt lives with son and daughter-in-law, pt drives and is independent with ADLS/IADLS. TX: IVF, renal US, tele, monitor BP, nephrology consult, ID consult, repeat BC, echo, monitor H&H, monitor glucose   Recommendation     DCP: home, son to provide transport       11/25/20 1230   Patient Type   Within 30 Days of Previous Admission? No   Healthcare Decisions   Interviewed: Patient   Orientation/Decision Making Abilities of Patient Alert and Oriented x3, able to make decisions   Advance Directive Patient does not have advance directive  (info given)   Additional Emergency Contacts? see face sheet   Prior to admission   Prior level of function Independent with ADLs   Home Layout Multi-level;Stairs to enter with rails (add number in comment)   Have running water, electricity, heat, etc? Yes   Living Arrangements Children   How do you get to your MD appointments? self   How do you get your groceries? self   Who fixes your meals? self   Who does your laundry? self   Who picks up your prescriptions? self   Dressing Independent   Grooming [place]   Name of Prior [place] no   Prior SNF admission? (Detail) no   Prior Rehab admission? (Detail) no   Discharge [place]   Patient expects to be discharged to: home   Anticipated  plan discussed with: Same as interviewed   Financial Resource Strain   How hard is it for you to pay for the very basics like food, housing, medical care, and heating? Not hard   Housing Stability   In the last 12 months, was there a time when you were not able to pay the mortgage or rent on time? N   In the  last 12 months, how many places have you lived? 2   In the last 12 months, was there a time when you did not have a steady place to sleep or slept in a shelter (including now)? N   Transportation Needs   In the past 12 months, has lack of transportation kept you from medical appointments or from getting medications? no   In the past 12 months, has lack of transportation kept you from meetings, work, or from getting things needed for daily living? No   Consults/Providers   PT Evaluation Needed 2   OT Evalulation Needed 2   SLP Evaluation Needed 2   Correct PCP listed in Epic? No (comment)   Family and PCP   PCP on file was verified as the current PCP? Provider not on file   PCP - first name Elmyra Ricks   PCP - last name Marin Comment   PCP - city [place]   PCP - state [place]   PCP - phone  [phone]   Important Message from Medicare Notice   Patient received 1st IMM Letter? n/a

## 2020-11-25 NOTE — Progress Notes (Signed)
Infectious Diseases & Tropical Medicine  Progress Note    11/25/2020   Elizabeth Dunlap [id number] is a 50 y.o. female,       Assessment:      Encephalopathy resolved   Blood culture-staph hominis (1/2)-likely contamination   Repeat blood cultures no growth to date   Diabetes mellitus   Coronary artery disease   Ischemic cardiomyopathy   Renal insufficiency   No fever or leukocytosis   Clinically stable    Plan:      Okay to discharge from infectious disease point of view   No outpatient antibiotics   Continue probiotics   Nephrology follow-up as an outpatient   Monitor electrolytes and renal functions closely   Monitor clinically   Discussed with patient in detail    Discussed with Dr. Jesse Sans                                        ROS:     . General:  no fever, no chills, no rigor, feeling better, no new issues  . HEENT: no neck pain, no throat pain  . Endocrine:  no fatigue, no night sweats  . Respiratory: no cough, shortness of breath, or wheezing   . Cardiovascular: no chest pain   . Gastrointestinal: no abdominal pain,no N/V/D  . Genito-Urinary: no dysuria or hematuria  . Musculoskeletal: no edema  . Neurological: c/o generalized weakness   . Dermatological: no rash, no ulcer    Physical Examination:     Blood pressure (!) 185/94, pulse 69, temperature 98.1 F (36.7 C), temperature source Oral, resp. rate 17, height 1.651 m ('5\' 5"'$ ), weight 87.7 kg (193 lb 4.8 oz), SpO2 99 %.     General Appearance: Comfortable   HEENT: Pupils are equal, round, and reactive to light.    Lungs:  Clear to auscultation   Heart:  Regular rate and rhythm   Chest: Symmetric chest wall expansion.    Abdomen: soft ,non tender,no hepatosplenomegaly,good bowel sounds   Neurological: No focal deficit   Extremities: No edema    Laboratory And Diagnostic Studies:     Recent Labs     11/25/20  0330 11/24/20  0504   WBC 5.39 6.37   Hgb 7.9* 8.1*   Hematocrit 25.3* 26.5*   Platelets 160 149     Recent  Labs     11/25/20  0330 11/24/20  0504   Sodium 140 139   Potassium 4.9 4.8   Chloride 112* 113*   CO2 20* 19*   BUN 36.0* 45.0*   Creatinine 2.8* 3.1*   Glucose 259* 189*   Calcium 8.4* 8.6     Recent Labs     11/24/20  0504 11/23/20  1523   AST (SGOT) 17 19   ALT 18 18   Alkaline Phosphatase 56 62   Protein, Total 5.8* 6.6   Albumin 2.9* 3.3*       Current Meds:      Scheduled Meds: PRN Meds:    aspirin EC, 81 mg, Oral, Daily  carvedilol, 25 mg, Oral, Q12H [facility]  clopidogrel, 75 mg, Oral, Daily  heparin (porcine), 5,000 Units, Subcutaneous, Q12H [facility]  insulin lispro, 1-3 Units, Subcutaneous, QHS  insulin lispro, 1-5 Units, Subcutaneous, TID AC  lactobacillus/streptococcus, 1 capsule, Oral, Daily  rosuvastatin, 10 mg, Oral, Daily        Continuous Infusions:   acetaminophen, 650  mg, Q6H PRN   Or  acetaminophen, 650 mg, Q6H PRN  glucagon (rDNA), 1 mg, PRN   And  dextrose, 250 mL, PRN   And  dextrose, 25 g, PRN   And  dextrose, 25 g, PRN  dextrose, 500 mL, Once PRN   And  dextrose, 0-50 g, Once PRN  dextrose, 50 mL, Once PRN  labetalol, 20 mg, Once PRN  melatonin, 3 mg, QHS PRN  naloxone, 0.2 mg, PRN  ondansetron, 4 mg, Q6H PRN   Or  ondansetron, 4 mg, Q6H PRN          Elizabeth Dunlap A. Gustavus Messing, M.D.  11/25/2020  10:04 AM

## 2020-11-25 NOTE — Plan of Care (Signed)
A&O X4. Denies pain. SR on the tele monitor. On RA. Self care with ADL.  Safety precaution maintained. Will continue to monitor.   Problem: Diabetes: Glucose Imbalance  Goal: Blood glucose stable at established goal  Outcome: Progressing  Flowsheets (Taken 11/25/2020 1222)  Blood glucose stable at established goal:   Monitor lab values   Include patient/family in decisions related to nutrition/dietary selections   Assess for hypoglycemia /hyperglycemia   Monitor/assess vital signs   Coordinate medication administration with meals, as indicated     Problem: Safety  Goal: Patient will be free from injury during hospitalization  Outcome: Progressing  Flowsheets (Taken 11/25/2020 1222)  Patient will be free from injury during hospitalization:   Assess patient's risk for falls and implement fall prevention plan of care per policy   Provide and maintain safe environment   Use appropriate transfer methods   Hourly rounding   Ensure appropriate safety devices are available at the bedside   Include patient/ family/ care giver in decisions related to safety     Problem: Pain  Goal: Pain at adequate level as identified by patient  Outcome: Progressing  Flowsheets (Taken 11/25/2020 1222)  Pain at adequate level as identified by patient:   Identify patient comfort function goal   Assess pain on admission, during daily assessment and/or before any "as needed" intervention(s)   Assess for risk of opioid induced respiratory depression, including snoring/sleep apnea. Alert healthcare team of risk factors identified.   Evaluate if patient comfort function goal is met   Reassess pain within 30-60 minutes of any procedure/intervention, per Pain Assessment, Intervention, Reassessment (AIR) Cycle   Evaluate patient's satisfaction with pain management progress     Problem: Hemodynamic Status: Cardiac  Goal: Stable vital signs and fluid balance  Outcome: Progressing  Flowsheets (Taken 11/25/2020 1222)  Stable vital signs and fluid balance:    Monitor/assess vital signs and telemetry per unit protocol   Monitor lab values   Weigh on admission and record weight daily   Assess signs and symptoms associated with cardiac rhythm changes     Problem: Renal Instability  Goal: Fluid and electrolyte balance are achieved/maintained  Outcome: Progressing  Flowsheets (Taken 11/25/2020 1222)  Fluid and electrolyte balance are achieved/maintained:   Monitor intake and output every shift   Monitor/assess lab values and report abnormal values   Provide adequate hydration   Monitor daily weight   Assess for confusion/personality changes   Assess and reassess fluid and electrolyte status

## 2020-11-25 NOTE — Discharge Summary (Signed)
[facility]      Patient: Elizabeth Dunlap  Admission Date: 11/23/2020   DOB: Oct 20, 1970  Discharge Date: 11/25/2020    MRN: [id number]  Discharge Attending:Ryanna Teschner Jesse Sans, MD     Referring Physician: Merryl Hacker None, MD  PCP: Pcp, None, MD       DISCHARGE SUMMARY     Discharge Information   Admission Diagnosis:   Hyperkalemia    Discharge Diagnosis:   Active Hospital Problems    Diagnosis   . Acute renal failure likely pre-renal superimposed on stage 4 chronic kidney disease   . Positive blood culture   . Class 1 obesity in adult   . CAD (coronary artery disease)   . Hypertensive urgency   . Type 2 diabetes mellitus, with long-term current use of insulin   . Hypokalemia   . Hyperkalemia   . DM type 2 (diabetes mellitus, type 2)   Hypertensive emergency with acute on chronic renal failure  9 mm hazy nodular opacity in the right lower lobe   Ruled out PNA     Admission Condition: Poor  Discharge Condition: Stable  Consultants: Nephrology, infectious disease  Functional Status: Baseline  Discharged to: Home/self-care    Discharge Medications:     Medication List      START taking these medications    hydrALAZINE 25 MG tablet  Commonly known as: APRESOLINE  Take 1 tablet (25 mg total) by mouth every 8 (eight) hours        CONTINUE taking these medications    aspirin EC 81 MG EC tablet     carvedilol 25 MG tablet  Commonly known as: COREG     clopidogrel 75 mg tablet  Commonly known as: PLAVIX     glipiZIDE XL 2.5 MG 24 hr tablet  Commonly known as: GLUCOTROL XL     insulin aspart 100 UNIT/ML injection  Commonly known as: NovoLOG     insulin glargine 100 UNIT/ML injection pen     IRON PO     rosuvastatin 40 MG tablet  Commonly known as: CRESTOR     VITAMIN D PO        STOP taking these medications    candesartan 32 MG tablet  Commonly known as: ATACAND           Where to Get Your Medications      These medications were sent to [id number] [place] [address] [place] [address]    Phone: [phone]   hydrALAZINE 25 MG tablet             Hospital Course   Presentation History   Elizabeth LUKSis a 50y.o. female with history of coronary artery disease status post stenting in 2019, ischemic cardiomyopathy bariatric surgery in 2020, chronic kidney disease type 2, diabetes mellitus, hypertension, hyperlipidemia, anemia, GERD who was advised to return to the ED for abnormal lab results. She was seen at the [facility] yesterday for presyncope/weakness due to hypoglycemia and chills.  She reports she took glipizide 2.5 and taking insulin 12 units novolog the night prior when her BG > 300. [facility] of 2 sets of blood cultures showed gram-positive cocci.  Patient has no complaints today.  She denies fever, cough, chest pain, shortness of breath, abdominal pain, nausea, vomiting, dysuria, hematuria.  Denies headache, dizziness, orthopnea, leg swelling, hematochezia, melena, hematuria, numbness/tingling, focal weakness.   In the  ED, patient's blood pressure was 190/91, afebrile, not tachycardic, not hypoxic.  Labs pertinent for elevated creatinine at 3.6 and potassium of 6.4.  She received the hyperkalemia treatment including insulin, D50, Kayexalate.  She also received vancomycin and azithromycin.    See HPI for details.    Hospital Course (2 Days)   Acute renal failure on CKD likely pre-renal   StageIVCKD  Hyperkalemia--> resolved   Metabolic acidosis improving   -baseline  Cr is ~ 2.2  -Followed by Elizabeth Dunlap at [facility] renal US--> unremarkable appearance of the kidneys and underdistended bladder  -Avoid nephrotoxins and hold home ARB  -Completed IV hydration  -Monitor renal function, lytes, UOP  -nephrology consulted discussed with Elizabeth Dunlap   -Improving renal function almost at baseline will be discharged and follow-up with nephrology as an outpatient on 11/29/2020.    Gram Positive Coccibacteremia likely contaminant  Ruled out  pneumonia  -Afebrile,  hemodynamically stable, no leukocytosis  -procalcitonin WNL.  -Repeat blood cultures  Negative thus far   -monitor without antibiotics   -Repeated 2 view CXR  Normal on 11/24/20   -check echocardiogram result  no evidence of valvular vegetation  -Elizabeth Dunlap consult appreciated, discussed with Elizabeth Dunlap agree with discharge on no antibiotic    Anemia of chronic disease  -Likely due to CKD  -Hgb in the 8 range  -Under care everywhere, labs show Hgb in the 9 range in 2020  -Monitor Hgb    Hypertensive urgency  -SBP 190s  -Resume home coreg added hydralazine will continue to hold candesartan  -Hold ARB due to worsening renal function    Type 2 DM  Recent episode of hypoglycemia to be due to combination of insulin and glipizide patient was advised to avoid combining glipizide with insulin  -Hold oral hypoglycemic agents  -Check A1c  -AC/HS acuchecks and low dose SSI    Hyperlipidemia- continue home crestor    CAD s/p stent in 2019  History of ischemic CM  Episode of non sustained SVT   -Sees [facility]  -Continue home medication aspirin, Plavix, Coreg, Crestor  Stated she still takes both aspirin and Plavix  -Holding ARB  Follow-up with cardiology as an outpatient    Class I obesity  History of bariatric surgery        Procedures/Imaging:   Upon my review:   CT Abd/ Pelvis without Contrast    Result Date: 11/22/2020    1. No acute process in the abdomen or pelvis on this noncontrast examination. 2. 9 mm hazy nodular opacity in the right lower lobe. This could be postinfectious or postinflammatory or even related to a component of atelectasis. Suggest follow-up CT of the chest in 2-4 weeks. Elizabeth Bapna, MD  11/22/2020 10:25 AM    XR Chest 2 Views    Result Date: 11/24/2020   No active disease is seen in the chest. Elizabeth Peers, MD  11/24/2020 8:59 AM    CT Head WO Contrast    Result Date: 11/22/2020    No acute intracranial abnormality. Elizabeth Bapna, MD  11/22/2020 8:25 AM    US Renal Kidney    Result  Date: 11/24/2020    Unremarkable appearance of the kidneys. Underdistended bladder. Elizabeth Bapna, MD  11/24/2020 4:07 PM    XR Chest  AP Portable    Result Date: 11/23/2020  Ill-defined opacities at the right lower lung, suspicious for pneumonia. Consider follow-up imaging in 4 weeks to confirm resolution. Elnita Maxwell, MD  11/23/2020  4:06 PM     XR Chest AP Portable    Result Date: 11/22/2020  No focal consolidation, pleural effusion, or pneumothorax. Cynda Familia, MD  11/22/2020 8:12 AM         Best Practices   Was the patient admitted with either a CHF Exacerbation or Pneumonia? No      Progress Note/Physical Exam at Discharge     Subjective: Is much better denies chest pain nausea vomiting abdominal pain ambulating with no problem reports mild headache    Vitals:    11/25/20 0757 11/25/20 0933 11/25/20 1152 11/25/20 1322   BP: 167/89 (!) 185/94 154/71 (!) 178/92   Pulse: 75 69 73    Resp: 17  13    Temp: 98.1 F (36.7 C)  97.7 F (36.5 C)    TempSrc: Oral  Oral    SpO2: 99%  99%    Weight:       Height:             General: NAD, AAOx3  HEENT: perrla, eomi, sclera anicteric, OP: Clear, MMM  Neck: supple, FROM, no LAD  Cardiovascular: RRR, no m/r/g  Lungs: CTAB, no w/r/r  Abdomen: soft, +BS, NT/ND, no masses, no g/r  Extremities: no C/C/E  Skin: no rashes or lesions noted  Neuro: CN 2-12 intact; No Focal neurological deficits       Diagnostics     Labs/Studies Pending at Discharge: No    Last Labs   Recent Labs   Lab 11/25/20  0330 11/24/20  0504 11/23/20  1523   WBC 5.39 6.37 7.39   RBC 2.85* 2.99* 3.00*   Hgb 7.9* 8.1* 8.4*   Hematocrit 25.3* 26.5* 26.7*   MCV 88.8 88.6 89.0   Platelets 160 149 178       Recent Labs   Lab 11/25/20  0330 11/24/20  0504 11/23/20  2159 11/23/20  1523 11/22/20  0754   Sodium 140 139 141 138 141   Potassium 4.9 4.8 4.8 6.4* 4.9   Chloride 112* 113* 111 111 112*   CO2 20* 19* 20* 18* 20*   BUN 36.0* 45.0* 46.0* 52.0* 48.0*   Creatinine 2.8* 3.1* 3.4* 3.6* 3.3*   Glucose 259* 189* 171* 224*  165*   Calcium 8.4* 8.6 9.1 9.1 9.5   Magnesium 1.7  --  1.5*  --   --        Microbiology Results (last 15 days)     Procedure Component Value Units Date/Time    Culture Blood Aerobic and Anaerobic [id number] Collected: 11/23/20 1523    Order Status: Completed Specimen: Arm from Blood, Venipuncture Updated: 11/24/20 1921    Narrative:      ORDER#: [id number]                                    ORDERED BY: Jacklynn Ganong  SOURCE: Blood, Venipuncture arm                      COLLECTED:  11/23/20 15:23  ANTIBIOTICS AT COLL.:                                RECEIVED :  11/23/20 19:02  Culture Blood Aerobic and Anaerobic        PRELIM      11/24/20 19:21  11/24/20  No Growth after 1 day/s of incubation.      Culture Blood Aerobic and Anaerobic [id number] Collected: 11/23/20 1523    Order Status: Completed Specimen: Arm from Blood, Venipuncture Updated: 11/24/20 1921    Narrative:      ORDER#: [id number]                                    ORDERED BY: Jacklynn Ganong  SOURCE: Blood, Venipuncture arm                      COLLECTED:  11/23/20 15:23  ANTIBIOTICS AT COLL.:                                RECEIVED :  11/23/20 19:02  Culture Blood Aerobic and Anaerobic        PRELIM      11/24/20 19:21  11/24/20   No Growth after 1 day/s of incubation.      Culture Blood Aerobic and Anaerobic [id number] Collected: 11/22/20 0828    Order Status: Completed Specimen: Arm from Blood, Venipuncture Updated: 11/24/20 0959    Narrative:      [id number] called Micro Results of Positive Blood Culture. Read back by: [id number],  by [id number] on 11/23/2020 at 08:30  ORDER#: [id number]                                    ORDERED BY: HE, ALBERT  SOURCE: Blood, Venipuncture Arm                      COLLECTED:  11/22/20 08:28  ANTIBIOTICS AT COLL.:                                RECEIVED :  11/22/20 11:28  [id number] called Micro Results of Positive Blood Culture. Read back by: [id number], by [id number] on 11/23/2020 at 08:30  Culture Blood Aerobic and Anaerobic         PRELIM      11/24/20 09:59   +  11/23/20   Aerobic Blood Culture Positive in less than 24 hrs             Gram Stain Shows: Gram positive cocci             Resembling Staphylococcus species  11/24/20   Anaerobic culture no growth to date, final report to follow  11/24/20   Growth of Staphylococcus hominis               Possible skin contaminant, susceptibility testing not             performed without request unless additional blood cultures,             collected within 48 hours of this culture, become positive.             Contact the laboratory for further information if necessary.        Culture Blood Aerobic and Anaerobic [id number] Collected: 11/22/20 0754    Order Status: Completed Specimen: Arm from Blood, Venipuncture Updated: 11/25/20 1221    Narrative:      ORDER#: [id number]  ORDERED BY: HE, ALBERT  SOURCE: Blood, Venipuncture arm                      COLLECTED:  11/22/20 07:54  ANTIBIOTICS AT COLL.:                                RECEIVED :  11/22/20 11:28  Culture Blood Aerobic and Anaerobic        PRELIM      11/25/20 12:21  11/23/20   No Growth after 1 day/s of incubation.  11/24/20   No Growth after 2 day/s of incubation.  11/25/20   No Growth after 3 day/s of incubation.      COVID-19 (SARS-COV-2) [employer] Rapid) [id number] Collected: 11/22/20 0754    Order Status: Completed Specimen: Nasopharyngeal Swab from Nasopharynx Updated: 11/22/20 0842     Purpose of COVID testing Screening     SARS-CoV-2 Specimen Source Nasopharyngeal     SARS CoV-2 Negative     Comment: Test performed using the Abbott ID NOW EUA assay.  Please see Fact Sheets for patients and providers located at:  http://olson-hall.info/    This test is for the qualitative detection of SARS-CoV-2  (COVID19) nucleic acid. Viral nucleic acids may persist in vivo,  independent of viability. Detection of viral nucleic acid does  not imply the presence of infectious virus, or that virus  nucleic  acid is the cause of clinical symptoms. Negative  results should be treated as presumptive and, if inconsistent  with clinical signs and symptoms or necessary for patient  management, should be tested with an alternative molecular  assay. Negative results do not preclude SARS-CoV-2 infection  and should not be used as the sole basis for patient  management decisions. Invalid results may be due to inhibiting  substances in the specimen and recollection should occur.         Narrative:      o Collect and clearly label specimen type:  o Upper respiratory specimen: One Nasopharyngeal Dry Swab NO  Transport Media.  o Hand deliver to laboratory ASAP  Indication for testing->Extended care facility admission to  semi private room  Screening    Rapid influenza A/B antigens [id number] Collected: 11/22/20 0754    Order Status: Completed Specimen: Nasopharyngeal Swab from Nasal Aspirate Updated: 11/22/20 0833    Narrative:      ORDER#: [id number]                                    ORDERED BY: HE, ALBERT  SOURCE: Nasal Aspirate                               COLLECTED:  11/22/20 07:54  ANTIBIOTICS AT COLL.:                                RECEIVED :  11/22/20 08:14  Influenza Rapid Antigen A&B                FINAL       11/22/20 08:33  11/22/20   Negative for Influenza A and B             Reference Range: Negative  Patient Instructions   Discharge Diet: Carb consistent diet  Discharge Activity: As tolerated    Follow Up Appointment:   Follow-up Information     your primary care doctor. Schedule an appointment as soon as possible for a visit in 1 week(s).    Why: for after discharge follow up and you need to repeat CT scan of the chest in 2 to 4 weeks for the 9 mm hazy nodular opacity in the right lower lung lobe.           your cardiologist. Schedule an appointment as soon as possible for a visit in 1 week(s).    Why: for after discharge follow up           your nephroogist / kidney doctor Follow up.    Why: continue to  hold Candesartan until you have repeated kidney function and seen by kidney doctor to clear you to start it.           Pcp, None, MD .                        Time spent examining patient, discussing with patient/family regarding hospital course, chart review, reconciling medications and discharge planning: 45  minutes.    Signed,  Dillard Essex, MD    1:28 PM 11/25/2020

## 2020-11-25 NOTE — Discharge Instr - AVS First Page (Addendum)
[facility] DISCHARGE INSTRUCTIONS     Date of Admission: 11/23/2020    Date of Discharge: 11/25/2020    Discharge Physician: Dillard Essex, MD    Dear Elizabeth Dunlap,     Thank you for choosing [facility] for your emergency care needs. We strive to provide EXCELLENT care to you and your family.     In an effort to explain clearly why you were here in the hospital, I've written a very brief summary. I hope that you find it useful. Other details including formal diagnosis, medication changes, follow up appointment recommendations, and access to MyChart for formal medical records can be found in this packet.       You were admitted for blood culture that was positive most likely is a contaminant no need for treatment repeated blood culture has been negative.    You had elevated potassium/hyperkalemia and worsening renal function on admission your candesartan has been held and you were given intravenous hydration both has improved continue to hold candesartan until you see your kidney doctor and repeat your kidney function panel.    Come back to the hospital if you develop chest pain, difficulty breathing, persistent fatigue, persistent fever or any concerning symptoms.    Combination of insulin and glipizide sometimes cause hypoglycemia meaning low blood sugar please be careful with using glipizide with insulin.    You need repeat CT scan of the chest in 2 to 4 weeks to ensure resolution of the 9 mm nodular opacity of the right lower lung lobe.      Make sure to follow up with our [facility] or PCP None, MD your primary care doctor for follow-up. I cannot stress the importance of follow up enough.    Make sure to bring the following to your doctors appointments:  Medications in their original bottles  Glucometer/blood sugar log (if diabetic)   Weight log (if you have heart failure)    If you are unable to obtain an appointment, unable to obtain newly prescribed medications, or are  unclear about any of your discharge instructions please contact me at [phone] (M-F, 8am-3pm) or weekends and after hours via the hospital operator [phone]) [phone], the hospital case manager, or your primary care physician.    Finally, as your discharging physician, you may be receiving a survey which is regarding my care. I would greatly value and appreciate your feedback as I strive for excellence.     Respectfully yours,    Dillard Essex, MD  11/25/2020

## 2020-11-25 NOTE — Plan of Care (Signed)
Discharge order verified, pt ID verified    Pt cleared for discharge. RN provided discharge instructions along with information regarding blood cx results, elevated kidney function, medication regimen, and f/u appointments to the pt. Pt verbalized understanding of the preceding information. All questions and concerns were addressed. Prescriptions sent to pt's pharmacy on file. PIV removed. Telemetry removed and returned. Pt ambulated off unit with all belongings by staff.    NAD noted, no complaints voiced.      VSSBP (!) 178/92   Pulse 73   Temp 97.7 F (36.5 C) (Oral)   Resp 13   Ht 1.651 m ('5\' 5"'$ )   Wt 87.7 kg (193 lb 4.8 oz)   SpO2 99%   BMI 32.17 kg/m

## 2020-11-27 NOTE — Progress Notes (Signed)
No further follow up required.   Reviewed by Dr. Lissa Merlin  Date: 11/27/20  Time: [id number]

## 2020-12-21 ENCOUNTER — Encounter (INDEPENDENT_AMBULATORY_CARE_PROVIDER_SITE_OTHER): Payer: Self-pay

## 2020-12-21 NOTE — Progress Notes (Signed)
Appt Date/Time: 12/26/20 @  2:30 pm     50 y.o. female   PCP: Pcp, None, MD  Referring Physician:     New Patient Follow-Up   x Post-op   [place] [facility] [place] [facility]  -05/21/19 venous insufficiency, ABI Other Imaging     Chief Complaint/HPI: Second opinion BLE edema, Telangiectasia.  -BLE venous insufficiency   -05/11/19 leg edema    Date of Last Visit:  05/18/19 Bobby Rumpf NP  -    Allergies:   Allergies   Allergen Reactions   . Penicillins Other (See Comments)     UNKNOWN   . Ace Inhibitors      cough       Selected Medications:    Coumadin Xarelto Eliquis Pradaxa Lovenox Other thinner:    Plavix Aspirin Brillinta Effient  Pletal Trental Fish Oil   Insulin Metformin Other Diabetes Atorvastatin Rosuvastatin Other Chol:    MHx:  Stroke / TIA / Seizures HTN / HLD  Diabetes   MI / CAD A-fib / Arrhythmia  COPD / Asthma / (other lung)   Kidney Disease Liver Disease DVT / Coagulopathy   Wounds Spine / other MSK  Cancer            Smoker           Former Smoker  x        Never Smoker    Vital Signs this Visit Pulses  HT  BP R BP L Temp   Rad Ulnar Brach Fem Pop DP PT        R          WT  Pulse R Pulse L SpO2       L          Imaging results:        Plan of Care:

## 2020-12-26 ENCOUNTER — Ambulatory Visit (INDEPENDENT_AMBULATORY_CARE_PROVIDER_SITE_OTHER): Payer: BC Managed Care – PPO | Admitting: Surgery

## 2020-12-26 VITALS — BP 142/84 | HR 86 | Temp 97.9°F | Wt 186.0 lb

## 2020-12-26 DIAGNOSIS — I781 Nevus, non-neoplastic: Secondary | ICD-10-CM

## 2020-12-26 NOTE — Progress Notes (Signed)
CONSULTATION  Vascular Surgery      Date Time: 2:58 PM  Referring Physician: Self  Consulting Physician: Erich Montane. Sharra Cayabyab II  Consulting Service: Vascular Surgery    Reason For Consultation:  Assess spider veins    History of Present Illness     Elizabeth Dunlap is a 50 y.o. female here for evaluation of spider veins on both lower extremities primarily the thighs.  She has no complaints suspicious with venous insufficiency and no varicosities.    Past History     Past Medical History:   Diagnosis Date   . Anemia    . Asthma without status asthmaticus     childhood   . Chronic kidney disease    . Diabetes mellitus type II, controlled    . DKA (diabetic ketoacidoses)    . GERD (gastroesophageal reflux disease)     occ.   . Vision abnormalities         Past Surgical History:   Procedure Laterality Date   . CESAREAN SECTION     . CORONARY ANGIOPLASTY WITH STENT PLACEMENT     . HYSTEROSCOPY, ENDOMETRIAL ABLATION, THERMACHOICE  10/08/2012    Procedure: HYSTEROSCOPY, ENDOMETRIAL ABLATION, THERMACHOICE;  Surgeon: Larae Grooms, MD;  Location: [facility];  Service: Gynecology;  Laterality: N/A;  thermachoice       No family history on file.    Social History     Socioeconomic History   . Marital status: Single   Tobacco Use   . Smoking status: Never Smoker   . Smokeless tobacco: Never Used   Vaping Use   . Vaping Use: Never used   Substance and Sexual Activity   . Alcohol use: No   . Drug use: No     Social Determinants of Health     Financial Resource Strain: Low Risk    . Difficulty of Paying Living Expenses: Not hard at all   Transportation Needs: No Transportation Needs   . Lack of Transportation (Medical): No   . Lack of Transportation (Non-Medical): No   Housing Stability: Low Risk    . Unable to Pay for Housing in the Last Year: No   . Number of Places Lived in the Last Year: 2   . Unstable Housing in the Last Year: No       Allergies   Allergen Reactions   . Penicillins Other (See Comments)     UNKNOWN   . Ace  Inhibitors      cough       Medications     '@IPPTAMEDS'$ @    Review of systems     A comprehensive ten review of systems was: Negative in the neurologic, respiratory, cardiac, vascular, gastrointestinal, genitourinary, musculoskeletal, immunologic,psychiatricor endocrinologic systems except as mentioned above.     Physical Exam     Vitals:    12/26/20 1448   BP: 142/84   Pulse: 86   Temp: 97.9 F (36.6 C)   SpO2: 97%       General:  Patient appears their stated age, well-nourished.  Alert and in no apparent distress.    Psych: AAOX3, appropriate mood and affect.    Skin color appropriate for race, warm, dry.    Cardiac: RRR,  no JVD.     Abd: Soft, nondistended, nontender. No guarding or rebound, mid line pulsatile mass  No    Extremities:Full ROM, symmetric; numerous thigh telangiectasias  Ischemic changes not present, Ulceration not present,  Gangrene not present, Peripheral edema:  No  Pulses: Easily palpable pulses bilaterally   veins: Varicose veins No, hyperpigmentation No, lipo-dermatosclerosis No      Neuro: CN II-XII intact, gross motor and sensory intact    Labs     Results     ** No results found for the last 24 hours. **          Imaging     No results found.      Assessment and Plan     Patient Active Problem List   Diagnosis   . Aphasia   . Rule out CVA (cerebral vascular accident)   . Anemia of chronic disease   . DM type 2 (diabetes mellitus, type 2)   . Acute kidney injury   . Volume depletion   . Dyslipidemia with low high density lipoprotein (HDL) cholesterol with hypertriglyceridemia due to type 2 diabetes mellitus   . Morbid obesity due to excess calories   . Vitamin D deficiency   . Bilateral leg edema   . Numbness of right foot   . Telangiectasia   . Hyperkalemia   . Acute renal failure likely pre-renal superimposed on stage 4 chronic kidney disease   . Positive blood culture   . Class 1 obesity in adult   . CAD (coronary artery disease)   . Hypertensive urgency   . Type 2 diabetes mellitus,  with long-term current use of insulin   . Hypokalemia       Assessment:  50 y.o. female with spider veins of both thighs without symptoms of venous insufficiency.  We will have her assessed for cosmetic sclerotherapy of her spider veins.    Plan:  Refer to [facility] for spider vein evaluation.    I performed a clinical assessment of this patient with 20 minutes spent in total patient care time in order to obtain an appropriate history and physical exam. My diagnostic impression and possible therapeutic options were discussed with the patient including the risks and benefits of such an approach and all questions were answered. Greater than 50% of the time was spent in this regard due to the complexity of the case.    Prescott Parma, MD MD

## 2021-06-04 ENCOUNTER — Emergency Department (HOSPITAL_COMMUNITY)
Admission: EM | Admit: 2021-06-04 | Discharge: 2021-06-04 | Disposition: A | Discharge status: Home / Self Care | Payer: BC Managed Care – PPO | Attending: Emergency Medicine | Admitting: Emergency Medicine

## 2021-06-04 ENCOUNTER — Emergency Department (HOSPITAL_COMMUNITY): Payer: BC Managed Care – PPO

## 2021-06-04 ENCOUNTER — Other Ambulatory Visit: Payer: Self-pay

## 2021-06-04 ENCOUNTER — Encounter (HOSPITAL_COMMUNITY): Payer: Self-pay

## 2021-06-04 DIAGNOSIS — E109 Type 1 diabetes mellitus without complications: Secondary | ICD-10-CM | POA: Diagnosis not present

## 2021-06-04 DIAGNOSIS — R059 Cough, unspecified: Secondary | ICD-10-CM | POA: Diagnosis present

## 2021-06-04 DIAGNOSIS — J209 Acute bronchitis, unspecified: Secondary | ICD-10-CM | POA: Diagnosis not present

## 2021-06-04 DIAGNOSIS — Z794 Long term (current) use of insulin: Secondary | ICD-10-CM | POA: Insufficient documentation

## 2021-06-04 MED ORDER — ALBUTEROL SULFATE HFA 108 (90 BASE) MCG/ACT IN AERS
2.0000 | INHALATION_SPRAY | Freq: Once | RESPIRATORY_TRACT | Status: AC
  Administered 2021-06-04: 2 via RESPIRATORY_TRACT
  Filled 2021-06-04: qty 6.7

## 2021-06-04 NOTE — Discharge Instructions (Signed)
Use albuterol inhaler, 1 to 2 puffs every 4-6 hours as needed for cough and wheezing. Also recommend Zyrtec daily, saline sinus rinse twice daily, Flonase daily. Recheck with your doctor if symptoms persist or new symptoms develop.

## 2021-06-04 NOTE — ED Provider Notes (Signed)
[facility] Note   CSN: [id number] Arrival date & time: 06/04/21  1858     History Chief Complaint  Patient presents with   Cough    Mariah Clark is a 50 y.o. female.  50 year old female presents with complaint of cough, occasionally productive with clear to yellow mucus and wheezing.  Patient states that she developed sinus congestion 1 to 2 weeks ago, treated with homeopathic remedy at home and seemed to improve however now has chest congestion.  States that she was admitted for pneumonia as a child, tends to get a chest cold every year and uses an albuterol inhaler periodically however it does not have an albuterol inhaler currently.  Notes chest congestion is worse in the morning.  History of diabetes, insulin-dependent, controlled.  Non-smoker, no history of asthma or chronic lung disease otherwise.      Past Medical History:  Diagnosis Date   Diabetes mellitus    DKA (diabetic ketoacidoses)     There are no problems to display for this patient.   Past Surgical History:  Procedure Laterality Date   CESAREAN SECTION       OB History   No obstetric history on file.     No family history on file.  Social History   Tobacco Use   Smoking status: Never   Smokeless tobacco: Never  Substance Use Topics   Alcohol use: No    Home Medications Prior to Admission medications   Medication Sig Start Date End Date Taking? Authorizing Provider  ferrous sulfate 325 (65 FE) MG tablet Take 325 mg by mouth daily with breakfast.    [provider]  insulin glargine (LANTUS) 100 UNIT/ML injection Inject 30 Units into the skin at bedtime.    [provider]  insulin lispro (HUMALOG) 100 UNIT/ML injection Inject 12 Units into the skin 3 (three) times daily before meals.    [provider]  ramipril (ALTACE) 2.5 MG capsule Take 2.5 mg by mouth daily.    [provider]  vitamin B-12  (CYANOCOBALAMIN) 1000 MCG tablet Take 1,000 mcg by mouth daily.    [provider]    Allergies    Penicillins and Ace inhibitors  Review of Systems   Review of Systems  Constitutional:  Negative for chills, diaphoresis and fever.  HENT:  Positive for congestion and postnasal drip. Negative for sore throat.   Respiratory:  Positive for cough and wheezing. Negative for shortness of breath.   Gastrointestinal:  Negative for nausea and vomiting.  Musculoskeletal:  Negative for arthralgias and myalgias.  Skin:  Negative for rash and wound.  Allergic/Immunologic: Positive for immunocompromised state.  Hematological:  Negative for adenopathy.  All other systems reviewed and are negative.  Physical Exam Updated Vital Signs BP (!) 173/87 (BP Location: Right Arm)   Pulse 90   Temp 99.6 F (37.6 C) (Oral)   Resp 16   Ht 5\' 8"  (1.727 m)   Wt 90.7 kg   SpO2 99%   BMI 30.41 kg/m   Physical Exam Vitals and nursing note reviewed.  Constitutional:      General: She is not in acute distress.    Appearance: She is well-developed. She is not diaphoretic.  HENT:     Head: Normocephalic and atraumatic.     Right Ear: Tympanic membrane, ear canal and external ear normal.     Left Ear: Tympanic membrane, ear canal and external ear normal.     Nose: Congestion  present.     Mouth/Throat:     Mouth: Mucous membranes are moist.     Pharynx: No oropharyngeal exudate.  Eyes:     Conjunctiva/sclera: Conjunctivae normal.  Cardiovascular:     Rate and Rhythm: Normal rate and regular rhythm.     Heart sounds: Normal heart sounds.  Pulmonary:     Effort: Pulmonary effort is normal.  Musculoskeletal:     Cervical back: Neck supple.  Lymphadenopathy:     Cervical: No cervical adenopathy.  Skin:    General: Skin is warm and dry.     Findings: No erythema or rash.  Neurological:     Mental Status: She is alert and oriented to person, place, and time.  Psychiatric:        Behavior:  Behavior normal.    ED Results / Procedures / Treatments   Labs (all labs ordered are listed, but only abnormal results are displayed) Labs Reviewed - No data to display  EKG None  Radiology DG Chest 2 View  Result Date: 06/04/2021 CLINICAL DATA:  Productive cough. EXAM: CHEST - 2 VIEW COMPARISON:  None. FINDINGS: The heart size and mediastinal contours are within normal limits. Both lungs are clear. The visualized skeletal structures are unremarkable. IMPRESSION: No active cardiopulmonary disease. Electronically Signed   By: Elgie Collard M.D.   On: 06/04/2021 20:17    Procedures Procedures   Medications Ordered in ED Medications  albuterol (VENTOLIN HFA) 108 (90 Base) MCG/ACT inhaler 2 puff (has no administration in time range)    ED Course  I have reviewed the triage vital signs and the nursing notes.  Pertinent labs & imaging results that were available during my care of the patient were reviewed by me and considered in my medical decision making (see chart for details).  Clinical Course as of 06/04/21 2326  Wynelle Link Jun 04, 2021  5153 50 year old female with cough with wheezing as above. On exam, well appearing, lungs clear at this time.  Vitals reviewed, O2 sat 90% on room air.  Chest x-ray is negative for pneumonia or other acute findings. Recommend Zyrtec, Flonase, saline sinus rinse.  Also will discharge home with albuterol inhaler.  Recheck for fevers, new or worsening symptoms. [LM]    Clinical Course User Index [LM] Alden Hipp   MDM Rules/Calculators/A&P                           Final Clinical Impression(s) / ED Diagnoses Final diagnoses:  Acute bronchitis, unspecified organism    Rx / DC Orders ED Discharge Orders     None        Jeannie Fend, PA-C 06/04/21 2326    Charlynne Pander, MD 06/04/21 2328

## 2021-06-04 NOTE — ED Triage Notes (Signed)
Pt reports cough, congestion and "snot" colored sputum. Request r/o pna.

## 2021-06-22 ENCOUNTER — Other Ambulatory Visit: Payer: Self-pay

## 2021-06-22 ENCOUNTER — Encounter (INDEPENDENT_AMBULATORY_CARE_PROVIDER_SITE_OTHER): Payer: BC Managed Care – PPO | Admitting: Ophthalmology

## 2021-06-22 DIAGNOSIS — I10 Essential (primary) hypertension: Secondary | ICD-10-CM | POA: Diagnosis not present

## 2021-06-22 DIAGNOSIS — H2513 Age-related nuclear cataract, bilateral: Secondary | ICD-10-CM

## 2021-06-22 DIAGNOSIS — H4312 Vitreous hemorrhage, left eye: Secondary | ICD-10-CM

## 2021-06-22 DIAGNOSIS — H35033 Hypertensive retinopathy, bilateral: Secondary | ICD-10-CM | POA: Diagnosis not present

## 2021-06-22 DIAGNOSIS — E113513 Type 2 diabetes mellitus with proliferative diabetic retinopathy with macular edema, bilateral: Secondary | ICD-10-CM

## 2021-09-22 ENCOUNTER — Encounter (INDEPENDENT_AMBULATORY_CARE_PROVIDER_SITE_OTHER): Payer: Self-pay

## 2021-09-22 ENCOUNTER — Encounter (INDEPENDENT_AMBULATORY_CARE_PROVIDER_SITE_OTHER): Payer: BC Managed Care – PPO | Admitting: Ophthalmology

## 2021-10-08 ENCOUNTER — Emergency Department (HOSPITAL_COMMUNITY): Payer: BC Managed Care – PPO

## 2021-10-08 ENCOUNTER — Encounter (HOSPITAL_COMMUNITY): Payer: Self-pay | Admitting: Emergency Medicine

## 2021-10-08 ENCOUNTER — Emergency Department (HOSPITAL_COMMUNITY)
Admission: EM | Admit: 2021-10-08 | Discharge: 2021-10-09 | Disposition: A | Discharge status: Home / Self Care | Payer: BC Managed Care – PPO | Attending: Emergency Medicine | Admitting: Emergency Medicine

## 2021-10-08 ENCOUNTER — Other Ambulatory Visit: Payer: Self-pay

## 2021-10-08 DIAGNOSIS — Z79899 Other long term (current) drug therapy: Secondary | ICD-10-CM | POA: Insufficient documentation

## 2021-10-08 DIAGNOSIS — N189 Chronic kidney disease, unspecified: Secondary | ICD-10-CM | POA: Diagnosis not present

## 2021-10-08 DIAGNOSIS — Z794 Long term (current) use of insulin: Secondary | ICD-10-CM | POA: Diagnosis not present

## 2021-10-08 DIAGNOSIS — I129 Hypertensive chronic kidney disease with stage 1 through stage 4 chronic kidney disease, or unspecified chronic kidney disease: Secondary | ICD-10-CM | POA: Diagnosis not present

## 2021-10-08 DIAGNOSIS — E1122 Type 2 diabetes mellitus with diabetic chronic kidney disease: Secondary | ICD-10-CM | POA: Insufficient documentation

## 2021-10-08 DIAGNOSIS — I10 Essential (primary) hypertension: Secondary | ICD-10-CM

## 2021-10-08 LAB — CBC
HCT: 29 % — ABNORMAL LOW (ref 36.0–46.0)
Hemoglobin: 9 g/dL — ABNORMAL LOW (ref 12.0–15.0)
MCH: 27.9 pg (ref 26.0–34.0)
MCHC: 31 g/dL (ref 30.0–36.0)
MCV: 89.8 fL (ref 80.0–100.0)
Platelets: 203 10*3/uL (ref 150–400)
RBC: 3.23 MIL/uL — ABNORMAL LOW (ref 3.87–5.11)
RDW: 12.5 % (ref 11.5–15.5)
WBC: 7.8 10*3/uL (ref 4.0–10.5)
nRBC: 0 % (ref 0.0–0.2)

## 2021-10-08 LAB — BASIC METABOLIC PANEL
Anion gap: 8 (ref 5–15)
BUN: 31 mg/dL — ABNORMAL HIGH (ref 6–20)
CO2: 26 mmol/L (ref 22–32)
Calcium: 9.1 mg/dL (ref 8.9–10.3)
Chloride: 106 mmol/L (ref 98–111)
Creatinine, Ser: 3.4 mg/dL — ABNORMAL HIGH (ref 0.44–1.00)
GFR, Estimated: 16 mL/min — ABNORMAL LOW (ref >60)
Glucose, Bld: 142 mg/dL — ABNORMAL HIGH (ref 70–99)
Potassium: 4.7 mmol/L (ref 3.5–5.1)
Sodium: 140 mmol/L (ref 135–145)

## 2021-10-08 LAB — TROPONIN I (HIGH SENSITIVITY)
Troponin I (High Sensitivity): 7 ng/L (ref <18)
Troponin I (High Sensitivity): 6 ng/L (ref <18)

## 2021-10-08 MED ORDER — HYDRALAZINE HCL 25 MG PO TABS: 25.0000 mg | ORAL_TABLET | Freq: Two times a day (BID) | ORAL | 0 refills | Status: AC | PRN

## 2021-10-08 NOTE — ED Provider Notes (Signed)
Patient is a 51 year old female whose care was transferred to me at shift change from [facility].  Please see her HPI below:  Mariah Clark is a 51 y.o. female hx of diabetes, CKD, prior cardiac stent here for evaluation of elevated blood pressure x 1 week.  She has been compliant with her home medication however she supposed to be taking hydralazine as needed for elevated blood pressure which she has been out of, prescribed by Nephrology in [place]. Taken off HCTZ due to "drying out kidney." She has been down here from [place] to have cataract surgery at [facility]  She was last seen by nephrology and endocrinology in [place] where she follows with told her kidney function was worsening.  She states every once a while she will get a "twinge" to her chest.  States she exercises multiple times weekly without any dyspnea on exertion or chest pain on exertion.  No lower extremity edema, erythema or warmth.  She is currently pain-free.  No headache, numbness, weakness, back pain, lower extremity swelling.  Her vision is at baseline.  No slurred speech.   Reviewed last Nephrology note> Rec Hydralazine 25 mg PRN for elevated BP Physical Exam  BP (!) 154/88 (BP Location: Right Arm)    Pulse 78    Temp 98.4 F (36.9 C) (Oral)    Resp 20    SpO2 100%   Physical Exam Vitals and nursing note reviewed.  Constitutional:      General: She is not in acute distress.    Appearance: She is well-developed.  HENT:     Head: Normocephalic and atraumatic.     Right Ear: External ear normal.     Left Ear: External ear normal.  Eyes:     General: No scleral icterus.       Right eye: No discharge.        Left eye: No discharge.     Conjunctiva/sclera: Conjunctivae normal.  Neck:     Trachea: No tracheal deviation.  Cardiovascular:     Rate and Rhythm: Normal rate.  Pulmonary:     Effort: Pulmonary effort is normal. No respiratory distress.     Breath sounds: No stridor.  Abdominal:     General: There is no  distension.  Musculoskeletal:        General: No swelling or deformity.     Cervical back: Neck supple.  Skin:    General: Skin is warm and dry.     Findings: No rash.  Neurological:     Mental Status: She is alert.     Cranial Nerves: Cranial nerve deficit: no gross deficits.   Procedures  Procedures  ED Course / MDM    Medical Decision Making Amount and/or Complexity of Data Reviewed Labs: ordered. Radiology: ordered.  Risk Prescription drug management.   Patient is a 51 year old female whose care was transferred to me from prior PA-C.  Please see her note below for additional information.  In summary, patient here for evaluation of elevated blood pressure as well as left arm pain.  Blood pressures been elevated over the past week.  She has been out of her hydralazine and is actually from [place] and here to have cataract surgery at [facility].  Tonight she noticed some pain in the left arm.  No chest pain.  Nonfocal neuro exam without deficits.  Previous provider felt that there was low suspicion for PE as well as DVT.  Patient's labs had resulted which appeared similar to patient's baseline.  At the time of shift change patient's initial troponin was 7 but was pending a delta troponin.  Repeat troponin has since resulted and is 6.  ECG does not appear ischemic.  Chest x-ray is negative.  Appears reassuring.  Previous PA-C provided a prescription for as needed hydralazine.  Feel the patient is stable for discharge at this time and she is agreeable.  Discussed return precautions.  She states that she is going to follow-up with her nephrologist regarding her kidney function.  Her questions were answered and she was amicable at the time of discharge.   Placido Sou, PA-C 10/09/21 0009    Nira Conn, MD 10/10/21 [phone]

## 2021-10-08 NOTE — ED Provider Notes (Signed)
[facility] EMERGENCY DEPARTMENT Provider Note   CSN: [id number] Arrival date & time: 10/08/21  1939    History  Chief Complaint  Patient presents with   Hypertension   Chest Pain    Mariah Clark is a 51 y.o. female hx of diabetes, CKD, prior cardiac stent here for evaluation of elevated blood pressure x 1 week.  She has been compliant with her home medication however she supposed to be taking hydralazine as needed for elevated blood pressure which she has been out of, prescribed by Nephrology in [place]. Taken off HCTZ due to "drying out kidney." She has been down here from [place] to have cataract surgery at [facility].  She was last seen by nephrology and endocrinology in [place] where she follows with told her kidney function was worsening.  She states every once a while she will get a "twinge" to her chest.  States she exercises multiple times weekly without any dyspnea on exertion or chest pain on exertion.  No lower extremity edema, erythema or warmth.  She is currently pain-free.  No headache, numbness, weakness, back pain, lower extremity swelling.  Her vision is at baseline.  No slurred speech.   Reviewed last Nephrology note> Rec Hydralazine 25 mg PRN for elevated BP  HPI     Home Medications Prior to Admission medications   Medication Sig Start Date End Date Taking? Authorizing Provider  ferrous sulfate 325 (65 FE) MG tablet Take 325 mg by mouth daily with breakfast.    [provider]  hydrALAZINE (APRESOLINE) 25 MG tablet Take 1 tablet (25 mg total) by mouth every 12 (twelve) hours as needed. 10/08/21  Yes Analisse Randle A, PA-C  insulin glargine (LANTUS) 100 UNIT/ML injection Inject 30 Units into the skin at bedtime.    [provider]  insulin lispro (HUMALOG) 100 UNIT/ML injection Inject 12 Units into the skin 3 (three) times daily before meals.    [provider]  ramipril (ALTACE) 2.5 MG capsule Take 2.5 mg by mouth  daily.    [provider]  vitamin B-12 (CYANOCOBALAMIN) 1000 MCG tablet Take 1,000 mcg by mouth daily.    [provider]      Allergies    Penicillins and Ace inhibitors    Review of Systems   Review of Systems  Constitutional: Negative.   HENT: Negative.    Respiratory: Negative.    Cardiovascular: Negative.   Gastrointestinal: Negative.   Genitourinary: Negative.   Musculoskeletal: Negative.   Skin: Negative.   Neurological: Negative.   All other systems reviewed and are negative.  Physical Exam Updated Vital Signs BP (!) 154/88 (BP Location: Right Arm)    Pulse 78    Temp 98.4 F (36.9 C) (Oral)    Resp 20    SpO2 100%  Physical Exam Vitals and nursing note reviewed.  Constitutional:      General: She is not in acute distress.    Appearance: She is well-developed. She is not ill-appearing, toxic-appearing or diaphoretic.  HENT:     Head: Atraumatic.  Eyes:     Pupils: Pupils are equal, round, and reactive to light.  Cardiovascular:     Rate and Rhythm: Normal rate.     Pulses: Normal pulses.          Radial pulses are 2+ on the right side and 2+ on the left side.     Heart sounds: Normal heart sounds.  Pulmonary:     Effort: Pulmonary effort  is normal. No respiratory distress.     Breath sounds: Normal breath sounds and air entry.  Abdominal:     General: Bowel sounds are normal. There is no distension.     Palpations: Abdomen is soft.     Tenderness: There is no abdominal tenderness.  Musculoskeletal:        General: Normal range of motion.     Cervical back: Normal range of motion.     Right lower leg: No tenderness. No edema.     Left lower leg: No tenderness. No edema.     Comments: Full rom, compartments soft  Skin:    General: Skin is warm and dry.     Capillary Refill: Capillary refill takes less than 2 seconds.  Neurological:     General: No focal deficit present.     Mental Status: She is alert.     Cranial Nerves: Cranial  nerves 2-12 are intact.     Sensory: Sensation is intact.     Motor: Motor function is intact.     Gait: Gait is intact.  Psychiatric:        Mood and Affect: Mood normal.    ED Results / Procedures / Treatments   Labs (all labs ordered are listed, but only abnormal results are displayed) Labs Reviewed  BASIC METABOLIC PANEL - Abnormal; Notable for the following components:      Result Value   Glucose, Bld 142 (*)    BUN 31 (*)    Creatinine, Ser 3.40 (*)    GFR, Estimated 16 (*)    All other components within normal limits  CBC - Abnormal; Notable for the following components:   RBC 3.23 (*)    Hemoglobin 9.0 (*)    HCT 29.0 (*)    All other components within normal limits  I-STAT BETA HCG BLOOD, ED ([facility], [facility], [facility] ONLY)  CBG MONITORING, ED  TROPONIN I (HIGH SENSITIVITY)  TROPONIN I (HIGH SENSITIVITY)    EKG None  Radiology DG Chest 2 View  Result Date: 10/08/2021 CLINICAL DATA:  Chest pain. EXAM: CHEST - 2 VIEW COMPARISON:  Chest radiograph dated 06/04/2021. FINDINGS: The heart size and mediastinal contours are within normal limits. Both lungs are clear. The visualized skeletal structures are unremarkable. IMPRESSION: No active cardiopulmonary disease. Electronically Signed   By: Anner Crete M.D.   On: 10/08/2021 21:34    Procedures Procedures    Medications Ordered in ED Medications - No data to display  ED Course/ Medical Decision Making/ A&P    51 year old here for evaluation of elevated blood pressure.  Afebrile, nonseptic, not ill-appearing.  Blood pressure elevated over the last week.  Sounds like they have been changed her meds last few meds. Per chart reviewed supposed to be on PRN meds however out. She is actually from [place] here to have cataract surgery at [facility] over the last few months.  Noted tonight some pain to her left arm earlier today.  She has a non-focal neuro exam without deficits.  She appears otherwise well.  No DOE or exertion CP at baseline  with ADLs. Low suspicion for PE, DVT. Does not appear fluid overloaded on exam.  Labs and imaging personally viewed and interpreted:  CBC without leukocytosis Metabolic panel glucose [id number], creatinine 3.4, last review in epic 3.08 Troponin 7, delt pending EKG without ischemic changes Chest x-ray without infiltrates, cardiomegaly, pulm edema, pneumothorax  Care transferred to [place], [place] who will fu on delta Trop.  If without significant  abnormality DC home with refill of her as needed BP meds, close follow-up with cardiology as well as her nephrologist for CKD.                           Medical Decision Making Amount and/or Complexity of Data Reviewed External Data Reviewed: labs, radiology, ECG and notes.    Details: [facility] Labs: ordered. Decision-making details documented in ED Course.    Details: creatinine 11/22 3.08 Radiology: ordered and independent interpretation performed. Decision-making details documented in ED Course. ECG/medicine tests: ordered and independent interpretation performed. Decision-making details documented in ED Course.  Risk OTC drugs. Prescription drug management.         Final Clinical Impression(s) / ED Diagnoses Final diagnoses:  Hypertension, unspecified type  Chronic kidney disease, unspecified CKD stage    Rx / DC Orders ED Discharge Orders          Ordered    hydrALAZINE (APRESOLINE) 25 MG tablet  Every 12 hours PRN        10/08/21 2305              Conlin Brahm A, PA-C 10/08/21 2321    Lucrezia Starch, MD 10/09/21 [phone]

## 2021-10-08 NOTE — ED Notes (Signed)
Called 3x to be taken to x-ray with no response.

## 2021-10-08 NOTE — ED Triage Notes (Signed)
Pt reported to ED with c/o elevated blood pressure all week. States she woke up last Sunday and her BP was 171/101 and has remained elavated since then. Pt expresses concerns because she has some chest pain that she describes as "tingling", and has hx of stent placement. Pt also endorses hx of diabetes with insulin dependency and CKD.

## 2021-10-09 NOTE — Discharge Instructions (Signed)
You been given a prescription for hydralazine.  This is a blood pressure medication you can use as needed for elevated blood pressure.  Please only take this as prescribed.  Please follow-up with your nephrologist regarding your kidney function.  If you develop any new or worsening symptoms please come back to the emergency department.

## 2021-10-22 ENCOUNTER — Encounter: Payer: Self-pay | Admitting: Emergency Medicine

## 2021-10-22 ENCOUNTER — Ambulatory Visit
Admission: EM | Admit: 2021-10-22 | Discharge: 2021-10-22 | Disposition: A | Discharge status: Home / Self Care | Payer: BC Managed Care – PPO | Attending: Internal Medicine | Admitting: Internal Medicine

## 2021-10-22 ENCOUNTER — Other Ambulatory Visit: Payer: Self-pay

## 2021-10-22 DIAGNOSIS — J029 Acute pharyngitis, unspecified: Secondary | ICD-10-CM | POA: Insufficient documentation

## 2021-10-22 DIAGNOSIS — J069 Acute upper respiratory infection, unspecified: Secondary | ICD-10-CM | POA: Diagnosis present

## 2021-10-22 LAB — POCT RAPID STREP A (OFFICE): Rapid Strep A Screen: NEGATIVE

## 2021-10-22 MED ORDER — FLUTICASONE PROPIONATE 50 MCG/ACT NA SUSP: 1.0000 | Freq: Every day | NASAL | 0 refills | Status: AC

## 2021-10-22 NOTE — Discharge Instructions (Signed)
Your rapid strep test was negative.  Throat culture, COVID-19, flu test is pending.  We will call if it is positive.  It appears that you have a viral illness that should self resolve in the next few days with symptomatic treatment.  You have been prescribed a nasal spray to help alleviate symptoms. ?

## 2021-10-22 NOTE — ED Provider Notes (Signed)
?[facility] ? ? ? ?CSN: [id number] ?Arrival date & time: 10/22/21  1329 ? ? ?  ? ?History   ?Chief Complaint ?Chief Complaint  ?Patient presents with  ? Sore Throat  ? ? ?HPI ?Mariah Clark is a 51 y.o. female.  ? ?Patient presents with nasal congestion, runny nose, cough, sore throat that has been present for approximately 3 days.  Denies any known fevers or sick contacts.  Patient has not taken any medications to help alleviate symptoms.  Denies cough, chest pain, shortness of breath, nausea, vomiting, diarrhea, abdominal pain.  Patient also has elevated blood pressure reading but reports that she has not taken her blood pressure medication today.  Denies chest pain, shortness of breath, headache, blurry vision, dizziness, nausea, vomiting. ? ? ?Sore Throat ? ? ?Past Medical History:  ?Diagnosis Date  ? Diabetes mellitus   ? DKA (diabetic ketoacidoses)   ? ? ?There are no problems to display for this patient. ? ? ?Past Surgical History:  ?Procedure Laterality Date  ? CESAREAN SECTION    ? ? ?OB History   ?No obstetric history on file. ?  ? ? ? ?Home Medications   ? ?Prior to Admission medications   ?Medication Sig Start Date End Date Taking? Authorizing Provider  ?carvedilol (COREG) 25 MG tablet Take by mouth.   Yes [provider]  ?clopidogrel (PLAVIX) 75 MG tablet Take by mouth.   Yes [provider]  ?ferrous sulfate 325 (65 FE) MG tablet Take 325 mg by mouth daily with breakfast.   Yes [provider]  ?fluticasone (FLONASE) 50 MCG/ACT nasal spray Place 1 spray into both nostrils daily for 3 days. 10/22/21 10/25/21 Yes Gustavus Sarra, FNP  ?hydrALAZINE (APRESOLINE) 25 MG tablet Take 1 tablet (25 mg total) by mouth every 12 (twelve) hours as needed. 10/08/21  Yes Henderly, Britni A, PA-C  ?HYDROCHLOROTHIAZIDE PO Take by mouth.   Yes [provider]  ?insulin glargine (LANTUS) 100 UNIT/ML injection Inject 30 Units into the skin at bedtime.   Yes [provider]  ?insulin lispro (HUMALOG) 100 UNIT/ML injection Inject 12 Units into the skin 3 (three) times daily before meals.   Yes [provider]  ?Probiotic Product (PROBIOTIC PO) Take by mouth.   Yes [provider]  ?ramipril (ALTACE) 2.5 MG capsule Take 2.5 mg by mouth daily.   Yes [provider]  ?Rosuvastatin Calcium (CRESTOR PO) Take by mouth.   Yes [provider]  ?VITAMIN D PO Take by mouth.   Yes [provider]  ?vitamin B-12 (CYANOCOBALAMIN) 1000 MCG tablet Take 1,000 mcg by mouth daily.    [provider]  ? ? ?Family History ?History reviewed. No pertinent family history. ? ?Social History ?Social History  ? ?Tobacco Use  ? Smoking status: Never  ? Smokeless tobacco: Never  ?Substance Use Topics  ? Alcohol use: No  ? ? ? ?Allergies   ?Penicillins and Ace inhibitors ? ? ?Review of Systems ?Review of Systems ?Per HPI ? ?Physical Exam ?Triage Vital Signs ?ED Triage Vitals  ?Enc Vitals Group  ?   BP 10/22/21 1428 (!) 176/118  ?   Pulse Rate 10/22/21 1428 87  ?   Resp 10/22/21 1428 18  ?   Temp 10/22/21 1428 100.2 ?F (37.9 ?C)  ?   Temp Source 10/22/21 1428 Oral  ?   SpO2 10/22/21 1428 99 %  ?   Weight 10/22/21 1429 184 lb (83.5 kg)  ?  Height 10/22/21 1429 5\' 5"  (1.651 m)  ?   Head Circumference --   ?   Peak Flow --   ?   Pain Score 10/22/21 1429 6  ?   Pain Loc --   ?   Pain Edu? --   ?   Excl. in GC? --   ? ?No data found. ? ?Updated Vital Signs ?BP (!) 176/118 (BP Location: Left Arm)   Pulse 87   Temp 100.2 ?F (37.9 ?C) (Oral)   Resp 18   Ht 5\' 5"  (1.651 m)   Wt 184 lb (83.5 kg)   SpO2 99%   BMI 30.62 kg/m?  ? ?Visual Acuity ?Right Eye Distance:   ?Left Eye Distance:   ?Bilateral Distance:   ? ?Right Eye Near:   ?Left Eye Near:    ?Bilateral Near:    ? ?Physical Exam ?Constitutional:   ?   General: She is not in acute distress. ?   Appearance: Normal appearance. She is not toxic-appearing or diaphoretic.  ?HENT:  ?   Head:  Normocephalic and atraumatic.  ?   Right Ear: Tympanic membrane and ear canal normal.  ?   Left Ear: Tympanic membrane and ear canal normal.  ?   Nose: Congestion present.  ?   Mouth/Throat:  ?   Mouth: Mucous membranes are moist.  ?   Pharynx: Posterior oropharyngeal erythema present.  ?Eyes:  ?   Extraocular Movements: Extraocular movements intact.  ?   Conjunctiva/sclera: Conjunctivae normal.  ?   Pupils: Pupils are equal, round, and reactive to light.  ?Cardiovascular:  ?   Rate and Rhythm: Normal rate and regular rhythm.  ?   Pulses: Normal pulses.  ?   Heart sounds: Normal heart sounds.  ?Pulmonary:  ?   Effort: Pulmonary effort is normal. No respiratory distress.  ?   Breath sounds: Normal breath sounds. No stridor. No wheezing, rhonchi or rales.  ?Abdominal:  ?   General: Abdomen is flat. Bowel sounds are normal.  ?   Palpations: Abdomen is soft.  ?Musculoskeletal:     ?   General: Normal range of motion.  ?   Cervical back: Normal range of motion.  ?Skin: ?   General: Skin is warm and dry.  ?Neurological:  ?   General: No focal deficit present.  ?   Mental Status: She is alert and oriented to person, place, and time. Mental status is at baseline.  ?   Cranial Nerves: Cranial nerves 2-12 are intact.  ?   Sensory: Sensation is intact.  ?   Motor: Motor function is intact.  ?   Coordination: Coordination is intact.  ?   Gait: Gait is intact.  ?Psychiatric:     ?   Mood and Affect: Mood normal.     ?   Behavior: Behavior normal.  ? ? ? ?UC Treatments / Results  ?Labs ?(all labs ordered are listed, but only abnormal results are displayed) ?Labs Reviewed  ?CULTURE, GROUP A STREP [facility]  ?COVID-19, FLU A+B [facility]  ?POCT RAPID STREP A (OFFICE)  ? ? ?EKG ? ? ?Radiology ?No results found. ? ?Procedures ?Procedures (including critical care time) ? ?Medications Ordered in UC ?Medications - No data to display ? ?Initial Impression / Assessment and Plan / UC Course  ?I have reviewed the triage vital signs and the nursing  notes. ? ?Pertinent labs & imaging results that were available during my care of the patient were reviewed by me and considered  in my medical decision making (see chart for details). ? ?  ? ?Patient presents with symptoms likely from a viral upper respiratory infection. Differential includes bacterial pneumonia, sinusitis, allergic rhinitis, COVID-19, flu. Do not suspect underlying cardiopulmonary process. Symptoms seem unlikely related to ACS, CHF or COPD exacerbations, pneumonia, pneumothorax. Patient is nontoxic appearing and not in need of emergent medical intervention.  Rapid strep was negative.  Throat culture, COVID-19, flu test pending. ? ?Recommended symptom control with over the counter medications.  Patient sent prescription. ? ?Patient has elevated blood pressure reading but has not taken her blood pressure medication.  Patient advised to restart blood pressure medication at previously prescribed dose and monitor at home.  Follow-up if remains elevated.  No signs of endorgan damage and neuro exam is normal so do not think the patient is in need of any more medical intervention at this time. ? ?Return if symptoms fail to improve in 1-2 weeks or you develop shortness of breath, chest pain, severe headache. Patient states understanding and is agreeable. ? ?Discharged with PCP followup.  ?Final Clinical Impressions(s) / UC Diagnoses  ? ?Final diagnoses:  ?Viral upper respiratory infection  ?Sore throat  ? ? ? ?Discharge Instructions   ? ?  ?Your rapid strep test was negative.  Throat culture, COVID-19, flu test is pending.  We will call if it is positive.  It appears that you have a viral illness that should self resolve in the next few days with symptomatic treatment.  You have been prescribed a nasal spray to help alleviate symptoms. ? ? ? ? ?ED Prescriptions   ? ? Medication Sig Dispense Auth. Provider  ? fluticasone (FLONASE) 50 MCG/ACT nasal spray Place 1 spray into both nostrils daily for 3 days. 16 g  Gustavus Hanko, [place]  ? ?  ? ?PDMP not reviewed this encounter. ?  ?Gustavus Dohse, [place] ?10/22/21 1518 ? ?

## 2021-10-22 NOTE — ED Triage Notes (Signed)
Patient c/o yellowish nasal drainage, productive cough, sore throat x 3 days.  Patient denies any OTC meds. ?

## 2021-10-23 ENCOUNTER — Telehealth: Payer: BC Managed Care – PPO | Admitting: Physician Assistant

## 2021-10-23 DIAGNOSIS — H66006 Acute suppurative otitis media without spontaneous rupture of ear drum, recurrent, bilateral: Secondary | ICD-10-CM

## 2021-10-23 LAB — COVID-19, FLU A+B NAA
Influenza A, NAA: NOT DETECTED
Influenza B, NAA: NOT DETECTED
SARS-CoV-2, NAA: NOT DETECTED

## 2021-10-23 MED ORDER — DOXYCYCLINE HYCLATE 100 MG PO TABS: 100.0000 mg | ORAL_TABLET | Freq: Two times a day (BID) | ORAL | 0 refills | Status: AC

## 2021-10-23 NOTE — Progress Notes (Signed)
?Virtual Visit Consent  ? ?Mariah Clark, you are scheduled for a virtual visit with a [facility] provider today.   ?  ?Just as with appointments in the office, your consent must be obtained to participate.  Your consent will be active for this visit and any virtual visit you may have with one of our providers in the next 365 days.   ?  ?If you have a MyChart account, a copy of this consent can be sent to you electronically.  All virtual visits are billed to your insurance company just like a traditional visit in the office.   ? ?As this is a virtual visit, video technology does not allow for your provider to perform a traditional examination.  This may limit your provider's ability to fully assess your condition.  If your provider identifies any concerns that need to be evaluated in person or the need to arrange testing (such as labs, EKG, etc.), we will make arrangements to do so.   ?  ?Although advances in technology are sophisticated, we cannot ensure that it will always work on either your end or our end.  If the connection with a video visit is poor, the visit may have to be switched to a telephone visit.  With either a video or telephone visit, we are not always able to ensure that we have a secure connection.    ? ?I need to obtain your verbal consent now.   Are you willing to proceed with your visit today?  ?  ?Mariah Clark has provided verbal consent on 10/23/2021 for a virtual visit (video or telephone). ?  ?Mariah Loveless, PA-C  ? ?Date: 10/23/2021 10:08 AM ? ? ?Virtual Visit via Video Note  ? ?Mariah Clark, connected with  Mariah Clark  ([id number], [birth year]) on 10/23/21 at 10:00 AM EDT by a video-enabled telemedicine application and verified that I am speaking with the correct person using two identifiers. ? ?Location: ?Patient: Virtual Visit Location Patient: Home ?Provider: Virtual Visit Location Provider: Home Office ?  ?I discussed the limitations of evaluation and  management by telemedicine and the availability of in person appointments. The patient expressed understanding and agreed to proceed.   ? ?History of Present Illness: ?Mariah Clark is a 51 y.o. who identifies as a female who was assigned female at birth, and is being seen today for possible sinus infection. ? ?HPI: Sinusitis ?This is a new problem. The current episode started in the past 7 days (4-5 days ago). The problem has been gradually worsening since onset. The maximum temperature recorded prior to her arrival was 102 - 102.9 F. The fever has been present for 1 to 2 days. The pain is moderate. Associated symptoms include chills, congestion, ear pain, headaches, sinus pressure and a sore throat. Pertinent negatives include no hoarse voice. Past treatments include oral decongestants. The treatment provided no relief.   ?Was seen at [facility] yesterday and prescribed flonase, suspected viral URI. Strep testing was negative. Flu and Covid testing still pending. ? ?Problems: There are no problems to display for this patient. ?  ?Allergies:  ?Allergies  ?Allergen Reactions  ? Penicillins Hives, Other (See Comments) and Rash  ?  UNKNOWN ?UNKNOWN ?UNKNOWN ?Other reaction(s): Other (See Comments) ?UNKNOWN ?UNKNOWN ?UNKNOWN ? ?UNKNOWN ?UNKNOWN ?Other reaction(s): Other (See Comments) ?UNKNOWN ?UNKNOWN ?UNKNOWN ?UNKNOWN ?UNKNOWN ?UNKNOWN ?UNKNOWN ?Other reaction(s): Other (See Comments) ?UNKNOWN ?UNKNOWN ?UNKNOWN  ? Ace Inhibitors   ?  cough ?cough ? ?Other reaction(s):  Cough ?cough ?cough ?cough ?cough  ? ?Medications:  ?Current Outpatient Medications:  ?  doxycycline (VIBRA-TABS) 100 MG tablet, Take 1 tablet (100 mg total) by mouth 2 (two) times daily., Disp: 20 tablet, Rfl: 0 ?  carvedilol (COREG) 25 MG tablet, Take by mouth., Disp: , Rfl:  ?  clopidogrel (PLAVIX) 75 MG tablet, Take by mouth., Disp: , Rfl:  ?  ferrous sulfate 325 (65 FE) MG tablet, Take 325 mg by mouth daily with breakfast., Disp: , Rfl:  ?  fluticasone  (FLONASE) 50 MCG/ACT nasal spray, Place 1 spray into both nostrils daily for 3 days., Disp: 16 g, Rfl: 0 ?  hydrALAZINE (APRESOLINE) 25 MG tablet, Take 1 tablet (25 mg total) by mouth every 12 (twelve) hours as needed., Disp: 30 tablet, Rfl: 0 ?  HYDROCHLOROTHIAZIDE PO, Take by mouth., Disp: , Rfl:  ?  insulin glargine (LANTUS) 100 UNIT/ML injection, Inject 30 Units into the skin at bedtime., Disp: , Rfl:  ?  insulin lispro (HUMALOG) 100 UNIT/ML injection, Inject 12 Units into the skin 3 (three) times daily before meals., Disp: , Rfl:  ?  Probiotic Product (PROBIOTIC PO), Take by mouth., Disp: , Rfl:  ?  ramipril (ALTACE) 2.5 MG capsule, Take 2.5 mg by mouth daily., Disp: , Rfl:  ?  Rosuvastatin Calcium (CRESTOR PO), Take by mouth., Disp: , Rfl:  ?  vitamin B-12 (CYANOCOBALAMIN) 1000 MCG tablet, Take 1,000 mcg by mouth daily., Disp: , Rfl:  ?  VITAMIN D PO, Take by mouth., Disp: , Rfl:  ? ?Observations/Objective: ?Patient is well-developed, well-nourished in no acute distress.  ?Resting comfortably at home.  ?Head is normocephalic, atraumatic.  ?No labored breathing.  ?Speech is clear and coherent with logical content.  ?Patient is alert and oriented at baseline.  ? ? ?Assessment and Plan: ?1. Recurrent acute suppurative otitis media without spontaneous rupture of tympanic membrane of both sides ?- doxycycline (VIBRA-TABS) 100 MG tablet; Take 1 tablet (100 mg total) by mouth 2 (two) times daily.  Dispense: 20 tablet; Refill: 0 ? ?- Worsening symptoms that have not responded to OTC medications.  ?- Will give Doxycycline ?- Continue allergy medications ?- Tylenol ok to use ?- Stay well hydrated and get plenty of rest.  ?- Seek in person evaluation if no symptom improvement or if symptoms worsen. ? ?Follow Up Instructions: ?I discussed the assessment and treatment plan with the patient. The patient was provided an opportunity to ask questions and all were answered. The patient agreed with the plan and demonstrated an  understanding of the instructions.  A copy of instructions were sent to the patient via MyChart unless otherwise noted below.  ? ?The patient was advised to call back or seek an in-person evaluation if the symptoms worsen or if the condition fails to improve as anticipated. ? ?Time:  ?I spent 10 minutes with the patient via telehealth technology discussing the above problems/concerns.   ? ?Mariah Loveless, PA-C ?

## 2021-10-23 NOTE — Patient Instructions (Signed)
Mariah HaywardKeshia B Clark, thank you for joining Margaretann LovelessJennifer M Manuel Dall, PA-C for today's virtual visit.  While this provider is not your primary care provider (PCP), if your PCP is located in our provider database this encounter information will be shared with them immediately following your visit.  Consent: (Patient) Mariah Clark provided verbal consent for this virtual visit at the beginning of the encounter.  Current Medications:  Current Outpatient Medications:    doxycycline (VIBRA-TABS) 100 MG tablet, Take 1 tablet (100 mg total) by mouth 2 (two) times daily., Disp: 20 tablet, Rfl: 0   carvedilol (COREG) 25 MG tablet, Take by mouth., Disp: , Rfl:    clopidogrel (PLAVIX) 75 MG tablet, Take by mouth., Disp: , Rfl:    ferrous sulfate 325 (65 FE) MG tablet, Take 325 mg by mouth daily with breakfast., Disp: , Rfl:    fluticasone (FLONASE) 50 MCG/ACT nasal spray, Place 1 spray into both nostrils daily for 3 days., Disp: 16 g, Rfl: 0   hydrALAZINE (APRESOLINE) 25 MG tablet, Take 1 tablet (25 mg total) by mouth every 12 (twelve) hours as needed., Disp: 30 tablet, Rfl: 0   HYDROCHLOROTHIAZIDE PO, Take by mouth., Disp: , Rfl:    insulin glargine (LANTUS) 100 UNIT/ML injection, Inject 30 Units into the skin at bedtime., Disp: , Rfl:    insulin lispro (HUMALOG) 100 UNIT/ML injection, Inject 12 Units into the skin 3 (three) times daily before meals., Disp: , Rfl:    Probiotic Product (PROBIOTIC PO), Take by mouth., Disp: , Rfl:    ramipril (ALTACE) 2.5 MG capsule, Take 2.5 mg by mouth daily., Disp: , Rfl:    Rosuvastatin Calcium (CRESTOR PO), Take by mouth., Disp: , Rfl:    vitamin B-12 (CYANOCOBALAMIN) 1000 MCG tablet, Take 1,000 mcg by mouth daily., Disp: , Rfl:    VITAMIN D PO, Take by mouth., Disp: , Rfl:    Medications ordered in this encounter:  Meds ordered this encounter  Medications   doxycycline (VIBRA-TABS) 100 MG tablet    Sig: Take 1 tablet (100 mg total) by mouth 2 (two) times daily.     Dispense:  20 tablet    Refill:  0    Order Specific Question:   Supervising Provider    Answer:   Hyacinth MeekerMILLER, BRIAN [[id number]]     *If you need refills on other medications prior to your next appointment, please contact your pharmacy*  Follow-Up: Call back or seek an in-person evaluation if the symptoms worsen or if the condition fails to improve as anticipated.  Other Instructions Sinusitis, Adult Sinusitis is inflammation of your sinuses. Sinuses are hollow spaces in the bones around your face. Your sinuses are located: Around your eyes. In the middle of your forehead. Behind your nose. In your cheekbones. Mucus normally drains out of your sinuses. When your nasal tissues become inflamed or swollen, mucus can become trapped or blocked. This allows bacteria, viruses, and fungi to grow, which leads to infection. Most infections of the sinuses are caused by a virus. Sinusitis can develop quickly. It can last for up to 4 weeks (acute) or for more than 12 weeks (chronic). Sinusitis often develops after a cold. What are the causes? This condition is caused by anything that creates swelling in the sinuses or stops mucus from draining. This includes: Allergies. Asthma. Infection from bacteria or viruses. Deformities or blockages in your nose or sinuses. Abnormal growths in the nose (nasal polyps). Pollutants, such as chemicals or irritants in the air. Infection  from fungi (rare). What increases the risk? You are more likely to develop this condition if you: Have a weak body defense system (immune system). Do a lot of swimming or diving. Overuse nasal sprays. Smoke. What are the signs or symptoms? The main symptoms of this condition are pain and a feeling of pressure around the affected sinuses. Other symptoms include: Stuffy nose or congestion. Thick drainage from your nose. Swelling and warmth over the affected sinuses. Headache. Upper toothache. A cough that may get worse at  night. Extra mucus that collects in the throat or the back of the nose (postnasal drip). Decreased sense of smell and taste. Fatigue. A fever. Sore throat. Bad breath. How is this diagnosed? This condition is diagnosed based on: Your symptoms. Your medical history. A physical exam. Tests to find out if your condition is acute or chronic. This may include: Checking your nose for nasal polyps. Viewing your sinuses using a device that has a light (endoscope). Testing for allergies or bacteria. Imaging tests, such as an MRI or CT scan. In rare cases, a bone biopsy may be done to rule out more serious types of fungal sinus disease. How is this treated? Treatment for sinusitis depends on the cause and whether your condition is chronic or acute. If caused by a virus, your symptoms should go away on their own within 10 days. You may be given medicines to relieve symptoms. They include: Medicines that shrink swollen nasal passages (topical intranasal decongestants). Medicines that treat allergies (antihistamines). A spray that eases inflammation of the nostrils (topical intranasal corticosteroids). Rinses that help get rid of thick mucus in your nose (nasal saline washes). If caused by bacteria, your health care provider may recommend waiting to see if your symptoms improve. Most bacterial infections will get better without antibiotic medicine. You may be given antibiotics if you have: A severe infection. A weak immune system. If caused by narrow nasal passages or nasal polyps, you may need to have surgery. Follow these instructions at home: Medicines Take, use, or apply over-the-counter and prescription medicines only as told by your health care provider. These may include nasal sprays. If you were prescribed an antibiotic medicine, take it as told by your health care provider. Do not stop taking the antibiotic even if you start to feel better. Hydrate and humidify  Drink enough fluid to  keep your urine pale yellow. Staying hydrated will help to thin your mucus. Use a cool mist humidifier to keep the humidity level in your home above 50%. Inhale steam for 10-15 minutes, 3-4 times a day, or as told by your health care provider. You can do this in the bathroom while a hot shower is running. Limit your exposure to cool or dry air. Rest Rest as much as possible. Sleep with your head raised (elevated). Make sure you get enough sleep each night. General instructions  Apply a warm, moist washcloth to your face 3-4 times a day or as told by your health care provider. This will help with discomfort. Wash your hands often with soap and water to reduce your exposure to germs. If soap and water are not available, use hand sanitizer. Do not smoke. Avoid being around people who are smoking (secondhand smoke). Keep all follow-up visits as told by your health care provider. This is important. Contact a health care provider if: You have a fever. Your symptoms get worse. Your symptoms do not improve within 10 days. Get help right away if: You have a severe  headache. You have persistent vomiting. You have severe pain or swelling around your face or eyes. You have vision problems. You develop confusion. Your neck is stiff. You have trouble breathing. Summary Sinusitis is soreness and inflammation of your sinuses. Sinuses are hollow spaces in the bones around your face. This condition is caused by nasal tissues that become inflamed or swollen. The swelling traps or blocks the flow of mucus. This allows bacteria, viruses, and fungi to grow, which leads to infection. If you were prescribed an antibiotic medicine, take it as told by your health care provider. Do not stop taking the antibiotic even if you start to feel better. Keep all follow-up visits as told by your health care provider. This is important. This information is not intended to replace advice given to you by your health care  provider. Make sure you discuss any questions you have with your health care provider. Document Revised: 12/30/2017 Document Reviewed: 12/30/2017 Elsevier Patient Education  2022 [employer].    If you have been instructed to have an in-person evaluation today at a local Urgent Care facility, please use the link below. It will take you to a list of all of our available [facility], including address, phone number and hours of operation. Please do not delay care.  [facility]  If you or a family member do not have a primary care provider, use the link below to schedule a visit and establish care. When you choose a [facility] primary care physician or advanced practice provider, you gain a long-term partner in health. Find a Primary Care Provider  Learn more about [facility] in-office and virtual care options: [facility] - Get Care Now

## 2021-10-24 LAB — CULTURE, GROUP A STREP (THRC)

## 2022-10-25 IMAGING — CR DG CHEST 2V
2 series · 2 of 2 positions shown · non-contrast
Comparison: None.

CLINICAL DATA: Productive cough.

EXAM:
CHEST - 2 VIEW

[w chest pa]
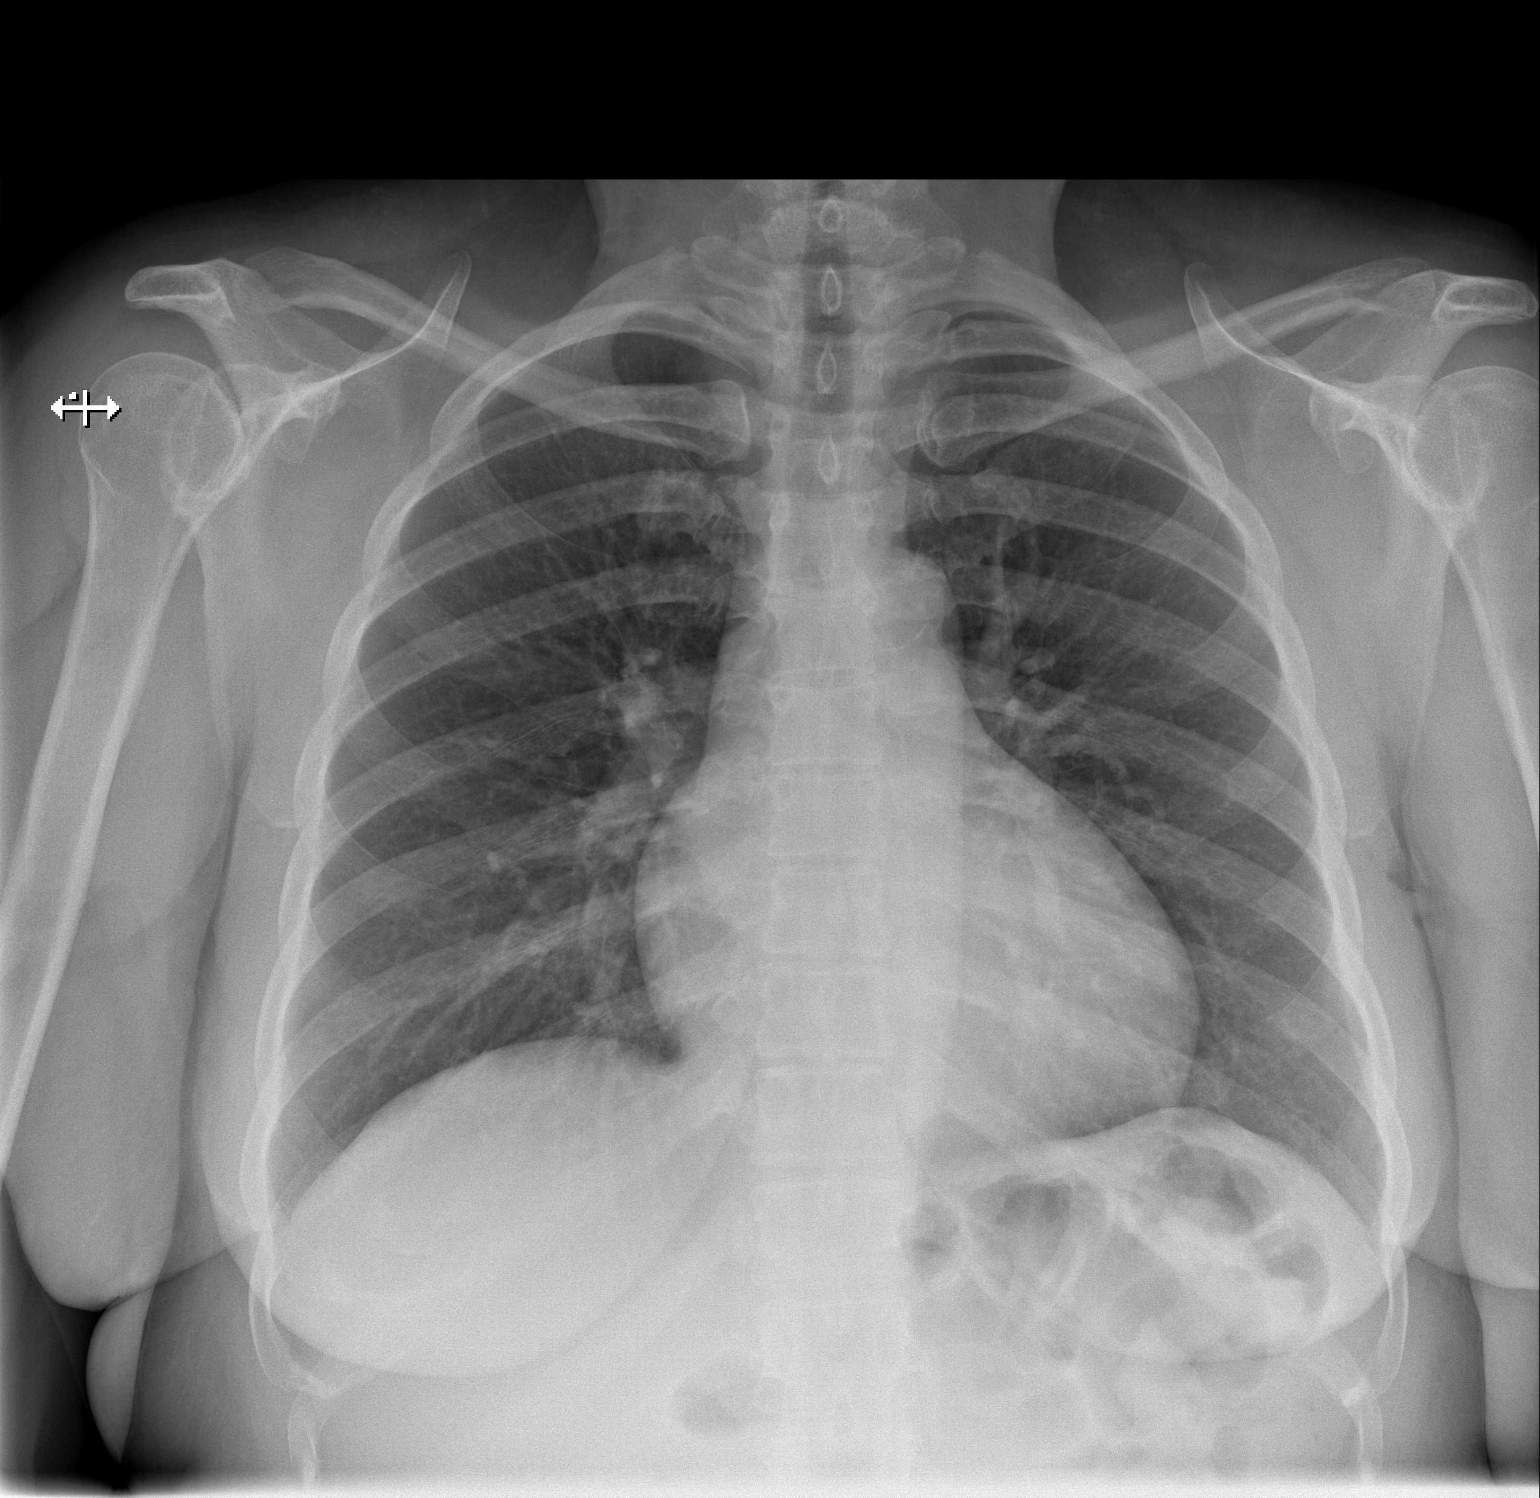

[w chest lat]
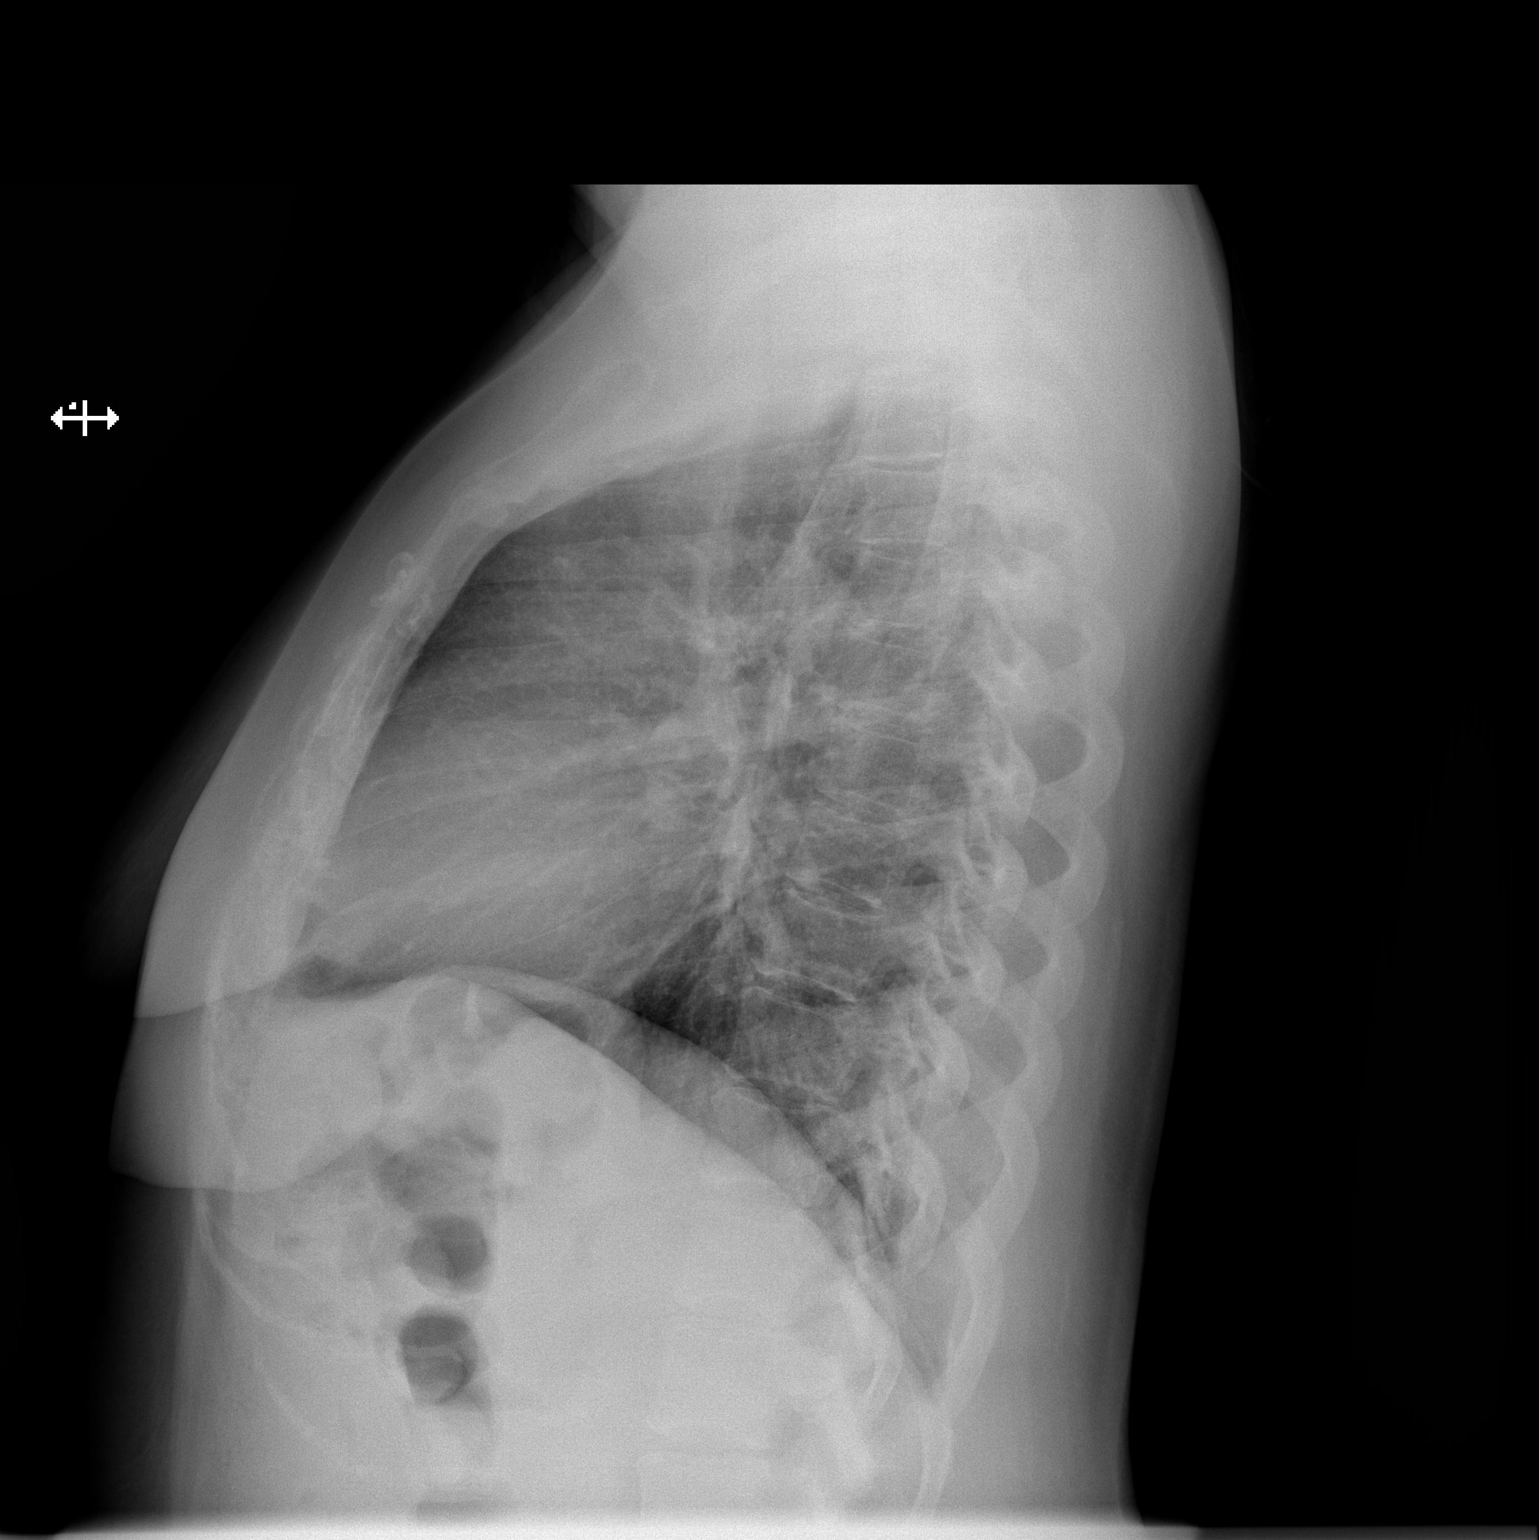

[2 of 2 positions shown; findings below may reference images not displayed]

FINDINGS: The heart size and mediastinal contours are within normal limits.
Both lungs are clear. The visualized skeletal structures are
unremarkable.
IMPRESSION: No active cardiopulmonary disease.

## 2023-02-28 IMAGING — CR DG CHEST 2V
2 series · 2 of 2 positions shown · non-contrast
Comparison: Chest radiograph dated 06/04/2021.

CLINICAL DATA: Chest pain.

EXAM:
CHEST - 2 VIEW

[chest pa]
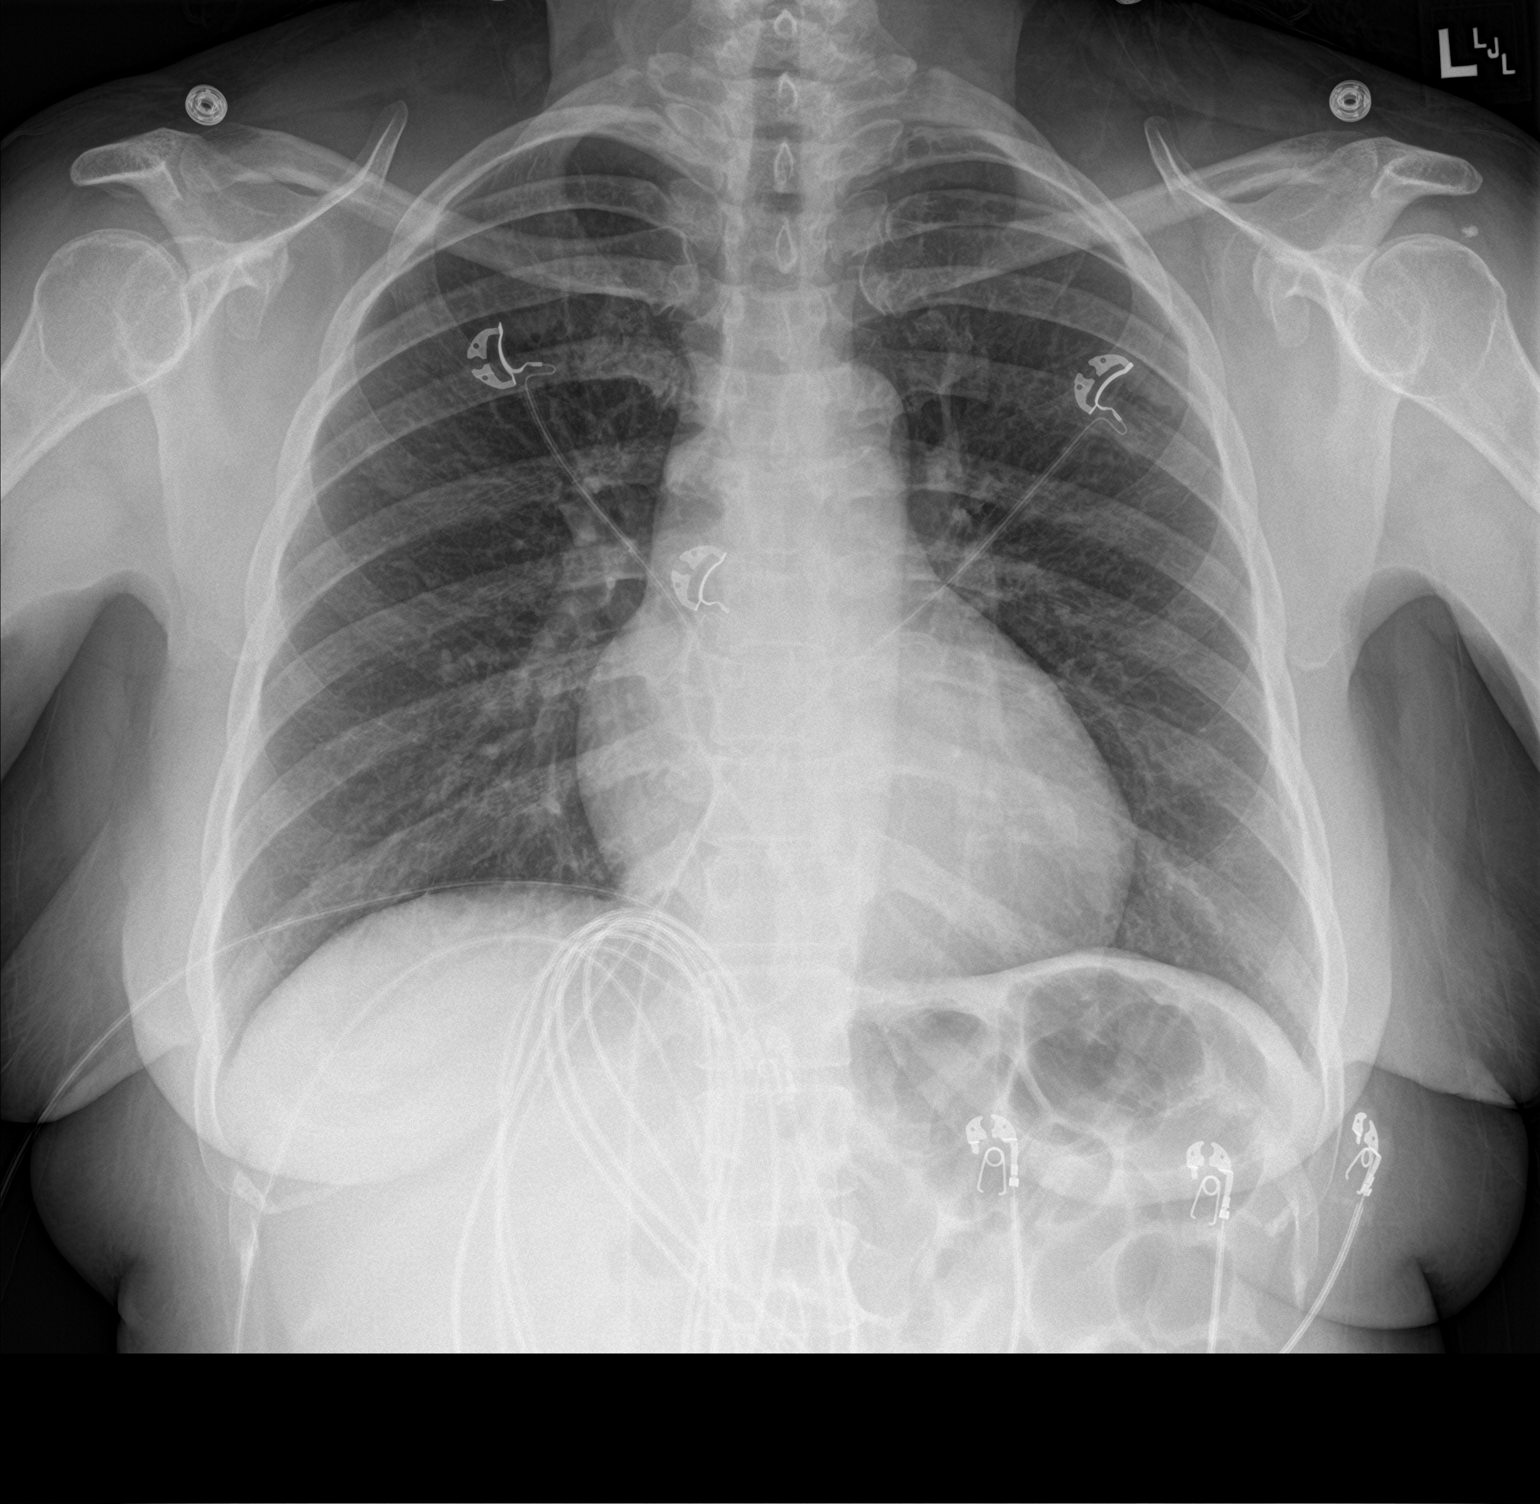

[chest lat]
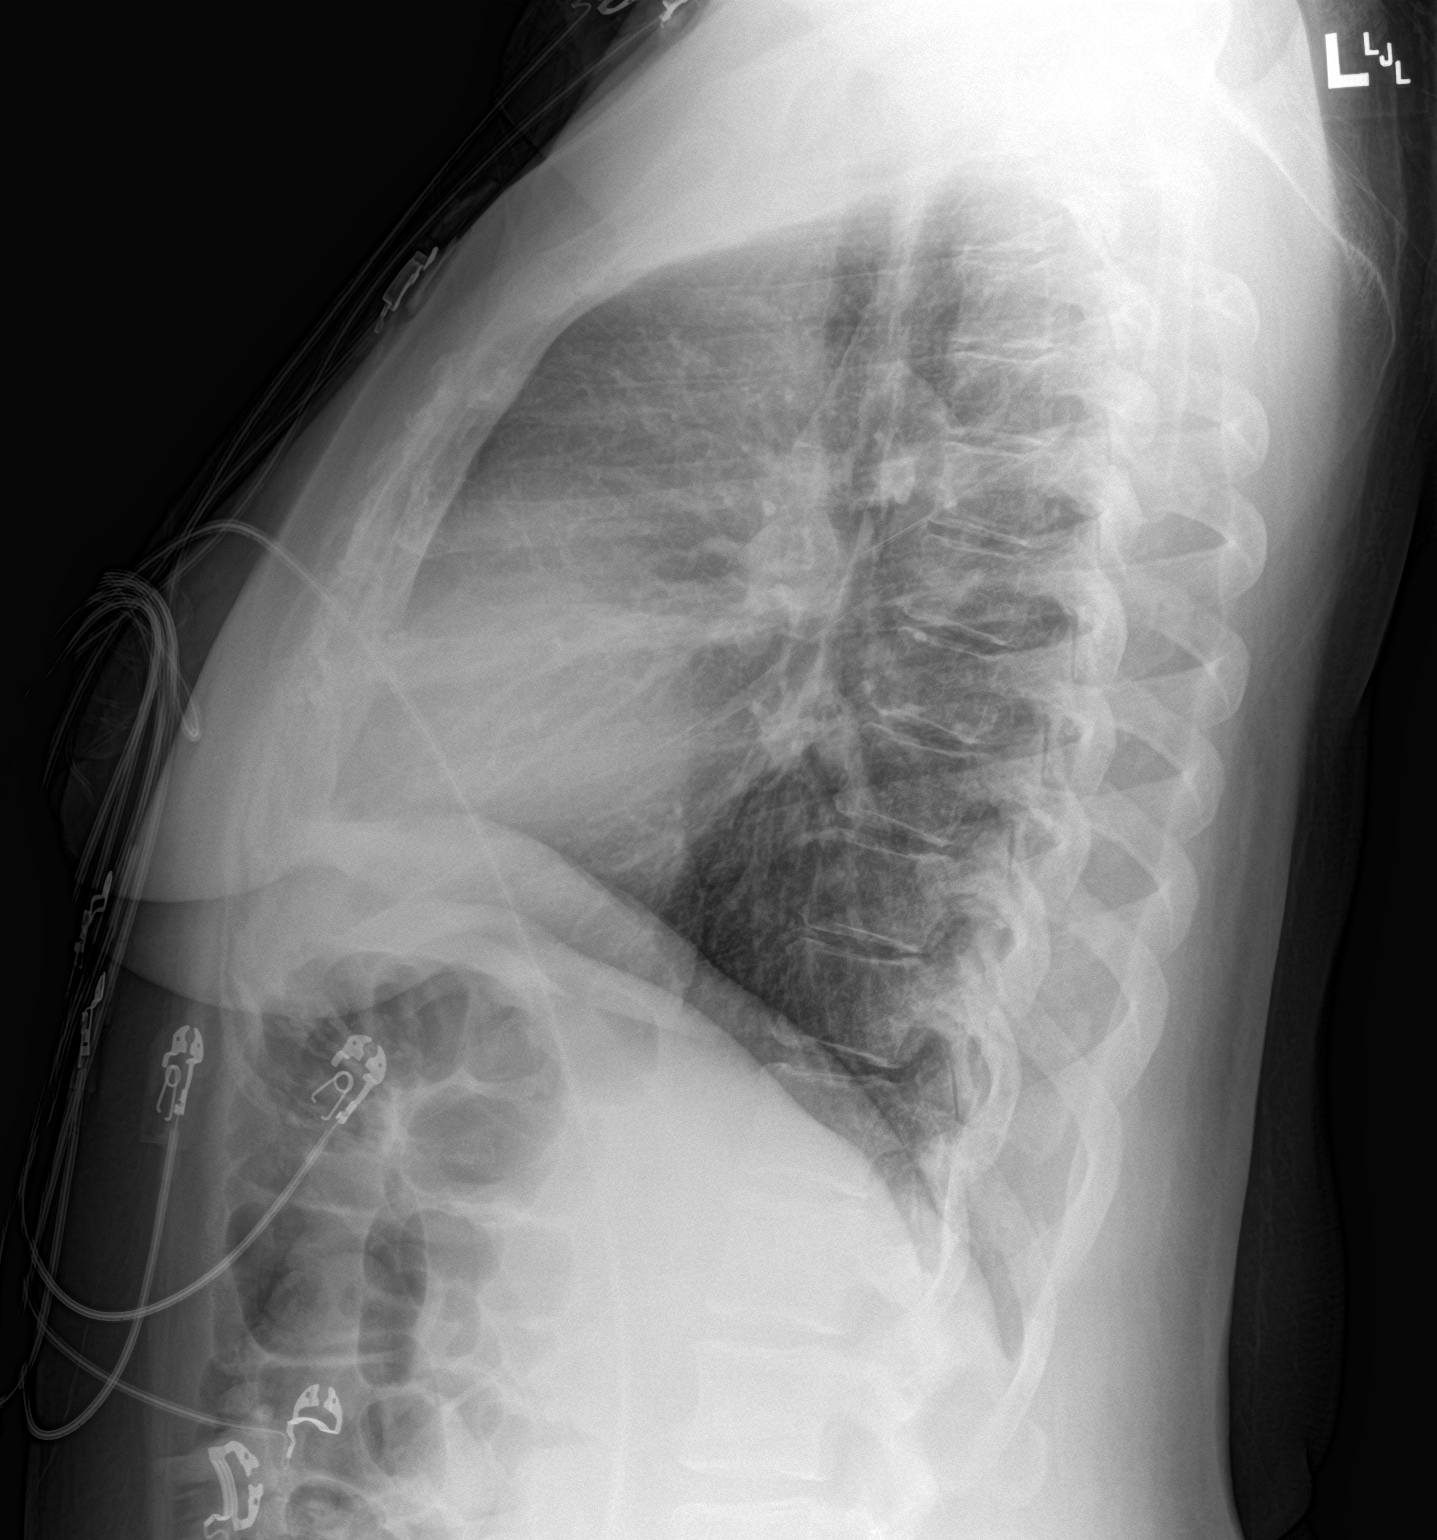

[2 of 2 positions shown; findings below may reference images not displayed]

FINDINGS: The heart size and mediastinal contours are within normal limits.
Both lungs are clear. The visualized skeletal structures are
unremarkable.
IMPRESSION: No active cardiopulmonary disease.
